# Patient Record
Sex: Female | Born: 1960 | Race: Black or African American | Hispanic: No | Smoking: Current every day smoker
Health system: Southern US, Community
[De-identification: ages and names within clinical notes are randomized; demographics above are authoritative.]

## PROBLEM LIST (undated history)

## (undated) DIAGNOSIS — D573 Sickle-cell trait: Secondary | ICD-10-CM

## (undated) DIAGNOSIS — I10 Essential (primary) hypertension: Secondary | ICD-10-CM

## (undated) DIAGNOSIS — F419 Anxiety disorder, unspecified: Secondary | ICD-10-CM

## (undated) DIAGNOSIS — M1990 Unspecified osteoarthritis, unspecified site: Secondary | ICD-10-CM

## (undated) DIAGNOSIS — Z5189 Encounter for other specified aftercare: Secondary | ICD-10-CM

## (undated) DIAGNOSIS — J449 Chronic obstructive pulmonary disease, unspecified: Secondary | ICD-10-CM

## (undated) HISTORY — DX: Anxiety disorder, unspecified: F41.9

## (undated) HISTORY — PX: COLONOSCOPY: SHX174

## (undated) HISTORY — PX: JOINT REPLACEMENT: SHX530

## (undated) HISTORY — PX: ABDOMINAL HYSTERECTOMY: SHX81

## (undated) HISTORY — DX: Chronic obstructive pulmonary disease, unspecified: J44.9

## (undated) HISTORY — PX: ROTATOR CUFF REPAIR: SHX139

## (undated) HISTORY — PX: FOOT SURGERY: SHX648

## (undated) HISTORY — DX: Encounter for other specified aftercare: Z51.89

## (undated) HISTORY — PX: TOTAL HIP ARTHROPLASTY: SHX124

## (undated) HISTORY — DX: Sickle-cell trait: D57.3

---

## 1998-09-08 ENCOUNTER — Emergency Department (HOSPITAL_COMMUNITY): Admission: EM | Admit: 1998-09-08 | Discharge: 1998-09-09 | Payer: Self-pay | Admitting: Emergency Medicine

## 1999-03-14 ENCOUNTER — Other Ambulatory Visit: Admission: RE | Admit: 1999-03-14 | Discharge: 1999-03-14 | Payer: Self-pay | Admitting: Internal Medicine

## 2000-05-05 ENCOUNTER — Other Ambulatory Visit: Admission: RE | Admit: 2000-05-05 | Discharge: 2000-05-05 | Payer: Self-pay | Admitting: Internal Medicine

## 2002-10-28 ENCOUNTER — Other Ambulatory Visit: Admission: RE | Admit: 2002-10-28 | Discharge: 2002-10-28 | Payer: Self-pay | Admitting: Obstetrics and Gynecology

## 2002-12-06 ENCOUNTER — Encounter (INDEPENDENT_AMBULATORY_CARE_PROVIDER_SITE_OTHER): Payer: Self-pay | Admitting: Specialist

## 2002-12-06 ENCOUNTER — Inpatient Hospital Stay (HOSPITAL_COMMUNITY): Admission: RE | Admit: 2002-12-06 | Discharge: 2002-12-09 | Payer: Self-pay | Admitting: Obstetrics and Gynecology

## 2004-10-11 ENCOUNTER — Ambulatory Visit (HOSPITAL_BASED_OUTPATIENT_CLINIC_OR_DEPARTMENT_OTHER): Admission: RE | Admit: 2004-10-11 | Discharge: 2004-10-11 | Payer: Self-pay | Admitting: Orthopaedic Surgery

## 2005-10-31 ENCOUNTER — Ambulatory Visit: Payer: Self-pay | Admitting: Internal Medicine

## 2005-11-08 ENCOUNTER — Ambulatory Visit: Payer: Self-pay | Admitting: Internal Medicine

## 2005-12-17 ENCOUNTER — Ambulatory Visit: Payer: Self-pay | Admitting: Internal Medicine

## 2005-12-17 ENCOUNTER — Ambulatory Visit (HOSPITAL_COMMUNITY): Admission: RE | Admit: 2005-12-17 | Discharge: 2005-12-17 | Payer: Self-pay | Admitting: Internal Medicine

## 2005-12-19 ENCOUNTER — Ambulatory Visit: Payer: Self-pay | Admitting: Internal Medicine

## 2005-12-25 ENCOUNTER — Ambulatory Visit: Payer: Self-pay | Admitting: Internal Medicine

## 2006-04-07 ENCOUNTER — Ambulatory Visit: Payer: Self-pay | Admitting: Internal Medicine

## 2007-02-12 ENCOUNTER — Ambulatory Visit: Payer: Self-pay | Admitting: Internal Medicine

## 2008-08-24 ENCOUNTER — Telehealth: Payer: Self-pay | Admitting: Internal Medicine

## 2008-09-13 ENCOUNTER — Telehealth (INDEPENDENT_AMBULATORY_CARE_PROVIDER_SITE_OTHER): Payer: Self-pay | Admitting: *Deleted

## 2009-06-01 ENCOUNTER — Ambulatory Visit: Payer: Self-pay | Admitting: Internal Medicine

## 2009-06-01 DIAGNOSIS — I10 Essential (primary) hypertension: Secondary | ICD-10-CM | POA: Insufficient documentation

## 2009-06-01 DIAGNOSIS — D509 Iron deficiency anemia, unspecified: Secondary | ICD-10-CM | POA: Insufficient documentation

## 2009-06-01 LAB — CONVERTED CEMR LAB
ALT: 11 units/L (ref 0–35)
Albumin: 4.3 g/dL (ref 3.5–5.2)
Alkaline Phosphatase: 69 units/L (ref 39–117)
Bilirubin, Direct: 0.1 mg/dL (ref 0.0–0.3)
CO2: 27 meq/L (ref 19–32)
Calcium: 9.5 mg/dL (ref 8.4–10.5)
Cholesterol: 163 mg/dL (ref 0–200)
Creatinine, Ser: 0.75 mg/dL (ref 0.40–1.20)
Glucose, Bld: 92 mg/dL (ref 70–99)
HDL: 60 mg/dL (ref >39)
Hemoglobin: 12.1 g/dL (ref 12.0–15.0)
Indirect Bilirubin: 0.4 mg/dL (ref 0.0–0.9)
LDL Cholesterol: 84 mg/dL (ref 0–99)
Lymphs Abs: 3.8 10*3/uL (ref 0.7–4.0)
MCHC: 31.1 g/dL (ref 30.0–36.0)
Monocytes Relative: 5 % (ref 3–12)
Neutro Abs: 4.5 10*3/uL (ref 1.7–7.7)
Platelets: 273 10*3/uL (ref 150–400)
RBC: 5.6 M/uL — ABNORMAL HIGH (ref 3.87–5.11)
Sodium: 144 meq/L (ref 135–145)
TSH: 0.549 microintl units/mL (ref 0.350–4.500)
Total Bilirubin: 0.5 mg/dL (ref 0.3–1.2)
Total CHOL/HDL Ratio: 2.7
Total Protein: 7.5 g/dL (ref 6.0–8.3)
Triglycerides: 96 mg/dL (ref <150)
VLDL: 19 mg/dL (ref 0–40)
WBC: 8.9 10*3/uL (ref 4.0–10.5)

## 2009-06-02 ENCOUNTER — Encounter: Payer: Self-pay | Admitting: Internal Medicine

## 2009-07-17 ENCOUNTER — Ambulatory Visit: Payer: Self-pay | Admitting: Internal Medicine

## 2009-07-17 DIAGNOSIS — J40 Bronchitis, not specified as acute or chronic: Secondary | ICD-10-CM | POA: Insufficient documentation

## 2009-11-16 ENCOUNTER — Ambulatory Visit: Payer: Self-pay | Admitting: Internal Medicine

## 2009-11-16 DIAGNOSIS — F17200 Nicotine dependence, unspecified, uncomplicated: Secondary | ICD-10-CM | POA: Insufficient documentation

## 2010-01-11 ENCOUNTER — Ambulatory Visit: Payer: Self-pay | Admitting: Internal Medicine

## 2010-01-11 ENCOUNTER — Telehealth: Payer: Self-pay | Admitting: Internal Medicine

## 2010-01-11 LAB — CONVERTED CEMR LAB
CO2: 31 meq/L (ref 19–32)
Glucose, Bld: 85 mg/dL (ref 70–99)
Sodium: 141 meq/L (ref 135–145)

## 2010-01-16 ENCOUNTER — Ambulatory Visit: Payer: Self-pay | Admitting: Internal Medicine

## 2010-10-24 ENCOUNTER — Encounter: Payer: Self-pay | Admitting: Internal Medicine

## 2010-11-13 NOTE — Assessment & Plan Note (Signed)
Summary: 2 month follow up/mhf rsc with pt Dr Artist Pais sick/mhf, reshed- jr   Vital Signs:  Patient profile:   50 year old female Weight:      149.50 pounds BMI:     28.35 O2 Sat:      99 % Temp:     97.9 degrees F oral Pulse rate:   66 / minute Pulse rhythm:   regular Resp:     16 per minute BP sitting:   110 / 70  (right arm) Cuff size:   regular  Vitals Entered By: Glendell Docker CMA (November 16, 2009 9:16 AM)  Primary Care Provider:  Dondra Spry DO  CC:  2 Month Follow up .  History of Present Illness: 2 Month Follow up   Hypertension Follow-Up      This is a 50 year old woman who presents for Hypertension follow-up.  The patient denies lightheadedness and headaches.  The patient denies the following associated symptoms: chest pain.  Compliance with medications (by patient report) has been near 100%.  The patient reports that dietary compliance has been fair.  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   Neck:  No deformities, masses, or tenderness noted. Lungs:  normal respiratory effort, normal breath sounds, and no wheezes.   Heart:  normal rate, regular rhythm, and no gallop.     Impression & Recommendations:  Problem # 1:  HYPERTENSION (ICD-401.9) Assessment  Improved BP is better but pt has chronic non productive cough.  likely ACE cough.  change to ARB.  If persistent cough, check CXR.  The following medications were removed from the medication list:    Lisinopril-hydrochlorothiazide 10-12.5 Mg Tabs (Lisinopril-hydrochlorothiazide) ..... One by mouth qd Her updated medication list for this problem includes:    Losartan Potassium-hctz 50-12.5 Mg Tabs (Losartan potassium-hctz) ..... One by mouth once daily  BP today: 110/70 Prior BP: 140/80 (07/17/2009)  Labs Reviewed: K+: 3.8 (06/01/2009) Creat: : 0.75 (06/01/2009)   Chol: 163 (06/01/2009)   HDL: 60 (06/01/2009)   LDL: 84 (06/01/2009)   TG: 96 (06/01/2009)  Problem # 2:  TOBACCO USER (ICD-305.1) Pt motivated to quit smoke.  reviewed how to use chantix.  we also discussed potential side effects.  Pt advised to report any significant change in mood  Her updated medication list for this problem includes:    Chantix Starting Month Pak 0.5 Mg X 11 & 1 Mg X 42 Tabs (Varenicline tartrate) .Marland Kitchen... Take as directed    Chantix Continuing Month Pak 1 Mg Tabs (Varenicline tartrate) .Marland Kitchen... Take as directed  Complete Medication List: 1)  Chantix Starting Month Pak 0.5 Mg X 11 & 1 Mg X 42 Tabs (Varenicline tartrate) .... Take as directed 2)  Chantix Continuing Month Pak 1 Mg  Tabs (Varenicline tartrate) .... Take as directed 3)  Losartan Potassium-hctz 50-12.5 Mg Tabs (Losartan potassium-hctz) .... One by mouth once daily  Patient Instructions: 1)  Please schedule a follow-up appointment in 2 months. 2)  BMP prior to visit, ICD-9: 401.9 3)  Please return for lab work one (1) week before your next appointment.  Prescriptions: LOSARTAN POTASSIUM-HCTZ 50-12.5 MG TABS (LOSARTAN POTASSIUM-HCTZ) one by mouth once daily  #30 x 3   Entered and Authorized by:   D. Thomos Lemons DO   Signed by:   D. Thomos Lemons DO on 11/16/2009   Method used:   Electronically to        [facility] [address] (retail)       [address]       [place], [place]  [address]       Ph: [phone]       Fax: [phone]   RxID:   [id number] CHANTIX CONTINUING MONTH PAK 1 MG TABS (VARENICLINE TARTRATE) take as directed  #1 x 3   Entered and Authorized by:   D. Thomos Lemons DO   Signed by:   D. Thomos Lemons DO on 11/16/2009   Method used:   Print then Give to Patient   RxID:   [id number] CHANTIX STARTING MONTH PAK 0.5 MG X 11 & 1 MG X 42 TABS (VARENICLINE TARTRATE) take as directed  #1 x 0   Entered and Authorized by:   D. Thomos Lemons DO   Signed by:   D. Thomos Lemons DO on 11/16/2009   Method used:   Print then Give to Patient   RxID:   [id number]   Current Allergies (reviewed today): LISINOPRIL (LISINOPRIL)   Immunization History:  Influenza Immunization History:    Influenza:  declined (11/16/2009)   Contraindications/Deferment of Procedures/Staging:    Test/Procedure: FLU VAX    Reason for deferment: patient declined

## 2010-11-13 NOTE — Assessment & Plan Note (Signed)
Summary: 2 month follow up/mhf   Vital Signs:  Patient profile:   50 year old female Height:      61 inches Weight:      149.75 pounds BMI:     28.40 O2 Sat:      100 % on Room air Temp:     98.2 degrees F oral Pulse rate:   59 / minute Pulse rhythm:   regular Resp:     18 per minute BP sitting:   120 / 72  (right arm) Cuff size:   regular  Vitals Entered By: Glendell Docker CMA (January 16, 2010 10:44 AM)  O2 Flow:  Room air CC: Rm 2- 2 Month Follow up  Comments chantix working well has cut back on smoking   Primary Care Provider:  DThomos Lemons DO  CC:  Rm 2- 2 Month Follow up .  History of Present Illness:  Hypertension Follow-Up      This is a 50 year old woman who presents for Hypertension follow-up.  The patient denies lightheadedness and headaches.  The patient denies the following associated symptoms: chest pain.  Compliance with medications (by patient report) has been near 100%.  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   Neck:  No deformities, masses, or tenderness noted. Lungs:  normal respiratory effort, normal breath sounds, and no wheezes.   Heart:  normal rate, regular rhythm, and no gallop.   Extremities:  No lower extremity edema    Impression &  Recommendations:  Problem # 1:  HYPERTENSION (ICD-401.9) cough resolved after stopping ACE.  Maintain current medication regimen.  Her updated medication list for this problem includes:    Losartan Potassium-hctz 50-12.5 Mg Tabs (Losartan potassium-hctz) ..... One by mouth once daily  BP today: 120/72 Prior BP: 110/70 (11/16/2009)  Labs Reviewed: K+: 3.4 (01/11/2010) Creat: : 0.7 (01/11/2010)   Chol: 163 (06/01/2009)   HDL: 60 (06/01/2009)   LDL: 84 (06/01/2009)   TG: 96 (06/01/2009)  Problem # 2:  TOBACCO USER (ICD-305.1) pt able to decrease tob use.  complete tob cessation encouraged  Her updated medication list for this problem includes:    Chantix Starting Month Pak 0.5 Mg X 11 & 1 Mg X 42 Tabs (Varenicline tartrate) .Marland Kitchen... Take as directed    Chantix Continuing Month Pak 1 Mg Tabs (Varenicline tartrate) .Marland Kitchen... Take as directed  Complete Medication List: 1)  Chantix Starting Month Pak 0.5 Mg X 11 & 1 Mg X 42 Tabs (Varenicline tartrate) .... Take as directed 2)  Chantix Continuing Month Pak 1 Mg Tabs (Varenicline tartrate) .... Take as directed 3)  Losartan Potassium-hctz 50-12.5 Mg Tabs (Losartan potassium-hctz) .... One by mouth once daily  Patient Instructions: 1)  Please schedule a follow-up appointment in  6 months. 2)  BMP prior to visit, ICD-9: 401.9 3)  Please return for lab work one (1) week before your next appointment.  Prescriptions: CHANTIX CONTINUING MONTH PAK 1 MG TABS (VARENICLINE TARTRATE) take as directed  #1 x 5   Entered and Authorized by:   D. Thomos Lemons DO   Signed by:   D. Thomos Lemons DO on 01/16/2010   Method used:   Electronically to        [facility] [address] (retail)       [address]       [place], [place]  [address]       Ph: [phone]       Fax: [phone]   RxID:   [id number] LOSARTAN POTASSIUM-HCTZ 50-12.5 MG TABS (LOSARTAN POTASSIUM-HCTZ) one by mouth once daily  #30 x 5   Entered and Authorized by:   D.  Thomos Lemons DO   Signed by:   D. Thomos Lemons DO on 01/16/2010   Method used:   Electronically to        [facility] [address] (retail)       [address]       [place], [place]  [address]       Ph: [phone]       Fax: [phone]   RxID:   [id number]   Current Allergies (reviewed today): LISINOPRIL (LISINOPRIL)

## 2010-11-13 NOTE — Progress Notes (Signed)
Summary: Lab Results  Phone Note Outgoing Call   Summary of Call: call pt - potassium is slightly low.  please advise pt to increase intake of high K foods Initial call taken by: D. Thomos Lemons DO,  January 11, 2010 4:42 PM  Follow-up for Phone Call        attempted to contact patient at [phone], no answer no voice mail Follow-up by: Glendell Docker CMA,  January 12, 2010 8:18 AM  Additional Follow-up for Phone Call Additional follow up Details #1::        Attempted to call pt. No answer or voicemail.  Pt. no longer works at wk # listed in chart.  Mervin Kung CMA  January 12, 2010 11:57 AM     Additional Follow-up for Phone Call Additional follow up Details #2::    patient advised per Dr Artist Pais instructions, she states she has an appointment tomorrow and will discuss with the Dr at the office visit. Follow-up by: Glendell Docker CMA,  January 15, 2010 8:23 AM

## 2010-11-15 NOTE — Letter (Signed)
Summary: Colonoscopy Letter  [facility]  [address] [place], [place] [address]   Phone: [phone]  Fax: [phone]      October 24, 2010 MRN: [id number]   MADDYN LIEURANCE [address] [place], [place]  [address]   Dear Ms. Cedeno,   According to your medical record, it is time for you to schedule a Colonoscopy. The American Cancer Society recommends this procedure as a method to detect early colon cancer. Patients with a family history of colon cancer, or a personal history of colon polyps or inflammatory bowel disease are at increased risk.  This letter has been generated based on the recommendations made at the time of your procedure. If you feel that in your particular situation this may no longer apply, please contact our office.  Please call our office at [phone] to schedule this appointment or to update your records at your earliest convenience.  Thank you for cooperating with [place] to provide you with the very best care possible.   Sincerely,  Hedwig Morton. Juanda Chance, M.D.  [facility] [phone]

## 2010-11-28 ENCOUNTER — Ambulatory Visit (INDEPENDENT_AMBULATORY_CARE_PROVIDER_SITE_OTHER): Payer: Self-pay | Admitting: Internal Medicine

## 2010-11-28 ENCOUNTER — Encounter: Payer: Self-pay | Admitting: Internal Medicine

## 2010-11-28 DIAGNOSIS — R51 Headache: Secondary | ICD-10-CM

## 2010-11-28 DIAGNOSIS — R519 Headache, unspecified: Secondary | ICD-10-CM | POA: Insufficient documentation

## 2010-11-28 DIAGNOSIS — I10 Essential (primary) hypertension: Secondary | ICD-10-CM

## 2010-12-20 NOTE — Assessment & Plan Note (Signed)
Summary: CONGESTION/MHF   Vital Signs:  Patient profile:   50 year old female Height:      61 inches Weight:      147 pounds BMI:     27.88 O2 Sat:      99 % on Room air Temp:     98.0 degrees F oral Pulse rate:   77 / minute Resp:     18 per minute BP sitting:   110 / 80  (left arm) Cuff size:   regular  Vitals Entered By: Glendell Docker CMA (November 28, 2010 10:32 AM)  O2 Flow:  Room air CC: facial problems Is Patient Diabetic? No Pain Assessment Patient in pain? no      Comments c/o teeth pain, right side facial swelling worsened today,     Last PAP Result Due   Primary Care Provider:  Dondra Spry DO  CC:  facial problems.  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   Head:  right facial swelling Mouth:  pharyngeal erythema.   dental caries right upper bicuspid and 1 molar Lungs:  normal respiratory effort, normal breath sounds, no crackles, and no wheezes.   Heart:  normal rate, regular rhythm, and no gallop.     Impression & Recommendations:  Problem # 1:  FACIAL PAIN (ICD-784.0) pt c/o right facial pain and swelling.  I suspect symptoms from dental caries / possible dental abscess.   take augmentin.  follow up with dentist Patient advised to call office if symptoms persist or worsen.  Problem # 2:   HYPERTENSION (ICD-401.9) Assessment: Unchanged  Her updated medication list for this problem includes:    Losartan Potassium-hctz 50-12.5 Mg Tabs (Losartan potassium-hctz) ..... One by mouth once daily  Orders: T-Basic Metabolic Panel [id number])  BP today: 110/80 Prior BP: 120/72 (01/16/2010)  Labs Reviewed: K+: 3.4 (01/11/2010) Creat: : 0.7 (01/11/2010)   Chol: 163 (06/01/2009)   HDL: 60 (06/01/2009)   LDL: 84 (06/01/2009)   TG: 96 (06/01/2009)  Complete Medication List: 1)  Losartan Potassium-hctz 50-12.5 Mg Tabs (Losartan potassium-hctz) .... One by mouth once daily 2)  Amoxicillin-pot Clavulanate 875-125 Mg Tabs (Amoxicillin-pot clavulanate) .... One by mouth two times a day  Patient Instructions: 1)  Follow up with your dentist re:  right facial swelling 2)  Call our office if your symptoms do not  improve or gets worse. 3)  Please schedule a follow-up appointment in 6 months. Prescriptions: LOSARTAN POTASSIUM-HCTZ 50-12.5 MG TABS (LOSARTAN POTASSIUM-HCTZ) one by mouth once daily  #90 x 1   Entered and Authorized by:   D. Thomos Lemons DO   Signed by:   D. Thomos Lemons DO on 11/28/2010   Method used:   Print then Give to Patient   RxID:   [id number] AMOXICILLIN-POT CLAVULANATE 875-125 MG TABS (AMOXICILLIN-POT CLAVULANATE) one  by mouth two times a day  #20 x 0   Entered and Authorized by:   D. Thomos Lemons DO   Signed by:   D. Thomos Lemons DO on 11/28/2010   Method used:   Print then Give to Patient   RxID:   [id number]    Orders Added: 1)  T-Basic Metabolic Panel [[id number]] 2)  Est. Patient Level III [14782]   Immunization History:  Influenza Immunization History:    Influenza:  declined (11/28/2010)   Immunization History:  Influenza Immunization History:    Influenza:  Declined (11/28/2010)  Current Allergies (reviewed today): LISINOPRIL (LISINOPRIL)      Preventive Care Screening  Mammogram:    Date:  11/28/2010    Results:  Due  Pap  Smear:    Date:  11/28/2010    Results:  Due

## 2011-03-01 NOTE — Discharge Summary (Signed)
NAME:  Katherine Jarvis, Katherine Jarvis                         ACCOUNT NO.:  [id number]   MEDICAL RECORD NO.:  [id number]                   PATIENT TYPE:  INP   LOCATION:  9115                                 FACILITY:  [facility]   PHYSICIAN:  Joslynne P. Romine, M.D.             DATE OF BIRTH:  07-Jul-1961   DATE OF ADMISSION:  12/06/2002  DATE OF DISCHARGE:  12/09/2002                                 DISCHARGE SUMMARY   DISCHARGE DIAGNOSES:  1. Uterine myomata.  2. Adenomyosis.  3. Benign ovarian fibroma.   HISTORY:  This is a 50 year old married black female gravida 4 para 3 with  menorrhagia requiring IV iron therapy that was unresponsive to oral  contraceptives, and an ultrasound had shown numerous myomata including what  was felt to be a 6 cm pedunculated fibroid.  She was status post a  salpingectomy for an ectopic, a BPSP, and a C section which required  transfusion in 1983.   HOSPITAL COURSE:  On December 06, 2002 she was admitted and underwent a TAH  and a right salpingo-oophorectomy.  What was felt to be a pedunculated  fibroid on exploratory laparotomy actually turned out to be a right ovarian  mass.  This came back from pathology consistent with a fibroma.  Postoperatively she did well.  Her postoperative H&H was 8 and 25.  She was  in satisfactory condition by postoperative day #3.   DISPOSITION:  She was given instructions and follow up in the office in six  weeks, a prescription for Percocet 5 mg #20 one p.o. q.4h. p.r.n. pain, and  instructions in pelvic rest.   LABORATORY DATA:  Pathology report did show leiomyomata and adenomyosis and  a right ovarian fibroma.  Peritoneal washings showed nonmalignant cells.  Admission H&H 12 and 38; on discharge 8 and 24.9.  Pregnancy test was  negative.  PT and PTT were normal.                                               Dayja P. Romine, M.D.    CPR/MEDQ  D:  01/03/2003  T:  01/04/2003  Job:  [id number]

## 2011-03-01 NOTE — Op Note (Signed)
NAME:  Katherine Jarvis, Katherine Jarvis               ACCOUNT NO.:  [id number]   MEDICAL RECORD NO.:  [id number]          PATIENT TYPE:  AMB   LOCATION:  DSC                          FACILITY:  [facility]   PHYSICIAN:  Vanita Panda. 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   PROCEDURES:  1.  Right shoulder arthroscopy with debridement.  2.  Subacromial decompression.   FINDINGS:  A small longitudinal rotator cuff tear and large bursectomy with  anterior acromial osteophytes.   SURGEON:  Vanita Panda. Magnus Ivan, M.D.   ANESTHESIA:  Regional anesthesia with interscalene block and general.   COMPLICATIONS:  None.   BLOOD LOSS:  Minimal.   INDICATIONS:  Briefly, Katherine Jarvis is a 50 year old, right-hand dominant  female with a history of shoulder pain for approximately one year.  She had  a fall earlier in the year and had shoulder pain since that time.  She has  had pain in other areas of her arm consistent with lateral epicondylitis as  well.  These things had resolved, but she continued to have pain in her  shoulder and an MRI was obtained.  It did show significant fluid in the  subacromial space with bursitis, as well as a small anterior leading edge  tear of the supraspinatus tendon.  After failure of nonoperative treatment  and conservative treatment, including physical therapy, injections and  nonsteroidals, she continued to have pain and it was recommended that she  underwent diagnostic arthroscopy with debridement versus repair with  subacromial decompression.   PROCEDURE DESCRIPTION:  After the regional block was obtained, she was  brought to the operating room and placed supine on the operating table and  general anesthesia was obtained.  She was then placed in a beach chair  position with her right arm  in traction of 10 pounds in forward elevation  and slight abduction.  The arm was prepped and draped with Duraprep and  sterile drapes.  First a posterior portal was made just off the edge of the  acromion in a soft spot and the arthroscope was inserted.  A diagnostic tour  was taken of the glenohumeral joint and there was noted synovitis, but the  humeral head looked to be intact with intact cartilage, as well as the  glenoid and the labrum.  There was also noted an intact biceps tendon.  There was a synovitis with the cuff and what appeared to be evidence of a  small longitudinal cuff tear.  An anterior portal was made __________ and an  arthroscopic shaver was inserted.  Limited debridement was performed within  the shoulder labrum.  The shaver was then removed and through the posterior  portal the arthroscope was inserted into the subacromial space.  There was  noted an abundant amount of bursectomy, as well as spurring of the acromion.  A lateral portal was made sharply with the knife and an ablator was placed  through this  portal.  A subacromial decompression was then performed, as  well as removing osteophytes on the undersurface of the acromion.  Once  complete bursectomy was performed, the shoulder was put through internal and  external rotation and the rotator cuff was found to be intact.  The scope  and ablator were removed.  The  portals were closed with interrupted 4-0 nylon sutures.  A well-padded  sterile dressing was then placed and the patient was placed in the arm  sling.  She was awakened, extubated and taken to the recovery room in stable  condition.  Again, there were no complications.       CYB/MEDQ  D:  10/11/2004  T:  10/11/2004  Job:  [id number]

## 2011-03-01 NOTE — Op Note (Signed)
NAME:  Katherine Jarvis, Katherine Jarvis                         ACCOUNT NO.:  [id number]   MEDICAL RECORD NO.:  [id number]                   PATIENT TYPE:  INP   LOCATION:  9399                                 FACILITY:  [facility]   PHYSICIAN:  Raeghan P. Romine, M.D.             DATE OF BIRTH:  09-Jul-1961   DATE OF PROCEDURE:  12/06/2002  DATE OF DISCHARGE:                                 OPERATIVE REPORT   PREOPERATIVE DIAGNOSES:  1. Menorrhagia.  2. Uterine fibroids.   POSTOPERATIVE DIAGNOSES:  1. Menorrhagia.  2. Uterine fibroids.  3. 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   PROCEDURE:  1. Total abdominal hysterectomy.  2. Right salpingo-oophorectomy.   SURGEON:  Raylei P. Romine, M.D.   ASSISTANT:  Andres Ege, M.D.   ANESTHESIA:  General endotracheal.   ESTIMATED BLOOD LOSS:  700 mL.   COMPLICATIONS:  None.   PROCEDURE:  The patient was taken to the operating room and after the  induction of adequate general endotracheal anesthesia was prepped and draped  in the usual fashion and Foley catheter inserted.  A Pfannenstiel incision  was made at the site of the previous Pfannenstiel.  Incision was carried  down to the fascia using a deep knife.  The fascia was nicked and opened  transversely.  Kochers were used to grasp the fascial margins and it was  separated from the underlying rectus muscles using sharp dissection.  Rectus  muscles were separated sharply in the midline.  The underlying peritoneum  was elevated between hemostats and entered atraumatically.  There were some  adhesions between the omentum and the anterior peritoneum on the patient's  right.  These were taken down sharply.  Exploration of the abdomen revealed  there to be an approximately 6 cm ovarian mass.  This was felt  preoperatively to have been a pedunculated fibroid.  The uterus contained  multiple small fibroids.  The left ovary and tube were normal.  The uterus  was grasped at its cornu with  long Kellys.  The round ligaments were  clamped, cut, and tied on either side.  The anterior leaf of the broad  ligament was taken down on the patient's right.  The ureter was identified.  The infundibulopelvic ligament was skeletonized, doubly clamped with  Heaneys, cut, and then doubly tied with 0 chromic.  On the patient's left  the ureter was identified.  A window was made in the broad ligament and a  clamp was placed across the pedicle containing the utero-ovarian ligament,  tube, and round ligament.  This was clamped, cut, and doubly tied with 0  chromic.  The ovary was then detached from the uterus and sent for frozen  section and the frozen section report came back showing a benign tumor  fibroma versus thecoma.  The anterior leaf of the broad ligament was taken  down.  The bladder was sharply removed from the cervix.  The uterine  arteries were skeletonized, clamped, and doubly tied on each side.  The  hysterectomy continued down the cardinal ligament clamping, cutting, and  tying in sequence.  The uterosacral ligaments were taken as separate  pedicles and held.  The vagina was entered at the corner on the right and  the specimen removed with Satinsky scissors.  Kochers were used to grasp the  vaginal cuff and angle stitches were placed in the right and left side.  The  vaginal cuff was then closed with interrupted figure-of-eight sutures of 0  chromic.  There was some bleeding underneath the bladder that was controlled  with a 2-0 figure-of-eight and also cautery.  The left ovary was suspended  to the round ligament.  The pelvis was irrigated and felt to be hemostatic.  Uterosacral ligaments were tied together in midline.  The packs that had  been used for retraction of the bowel were removed.  Sponge, needle, and  instrument counts were correct at that point.  The peritoneum was closed in  a running fashion using 2-0 chromic.  The fascia was closed in a running  fashion going  from each angle towards the midline using 0 Vicryl.  Hemostasis was achieved in the subcutaneous tissue with the Bovie.  The  subcutaneous tissue and fascia were injected with a total of 10 mL of 0.5%  Marcaine with epinephrine.  The incision was closed subcuticularly with 3-0  Vicryl Rapide.  Benzoin was applied to the skin and Steri-Strips were used.  Closed the skin and the procedure was terminated.  The patient tolerated it  well and went in satisfactory condition to post anesthesia recovery.                                               Juli P. Romine, M.D.    CPR/MEDQ  D:  12/06/2002  T:  12/06/2002  Job:  [id number]

## 2011-03-15 ENCOUNTER — Telehealth: Payer: Self-pay | Admitting: Internal Medicine

## 2011-03-15 MED ORDER — LOSARTAN POTASSIUM-HCTZ 50-12.5 MG PO TABS: 1.0000 | ORAL_TABLET | Freq: Every day | ORAL | Status: DC

## 2011-03-15 NOTE — Telephone Encounter (Signed)
Rx refill sent to pharmacy. 

## 2011-03-15 NOTE — Telephone Encounter (Signed)
Refill- losartan/hctz 50/12.5mg  tablets. Take 1 tablet by mouth every day. Qty 30. Last fill 3.22.12

## 2011-11-11 ENCOUNTER — Telehealth: Payer: Self-pay | Admitting: Internal Medicine

## 2011-11-11 MED ORDER — LOSARTAN POTASSIUM-HCTZ 50-12.5 MG PO TABS: 1.0000 | ORAL_TABLET | Freq: Every day | ORAL | Status: DC

## 2011-11-11 NOTE — Telephone Encounter (Signed)
rx sent in electronically 

## 2011-11-11 NOTE — Telephone Encounter (Signed)
Pt moved and has lost her rx for Losartan. Please call in a new rx to [facility]. Thanks.

## 2012-02-26 ENCOUNTER — Telehealth: Payer: Self-pay | Admitting: Internal Medicine

## 2012-02-26 MED ORDER — LOSARTAN POTASSIUM-HCTZ 50-12.5 MG PO TABS: 1.0000 | ORAL_TABLET | Freq: Every day | ORAL | Status: DC

## 2012-02-26 NOTE — Telephone Encounter (Signed)
Rx of BP medication provided

## 2012-02-26 NOTE — Telephone Encounter (Signed)
Pt called is req to get a script for losartan-hydrochlorothiazide (HYZAAR) 50-12.5 MG per tablet called in to [facility] on [address] or would like to have a written script for pick up since pts son is in for ov today to see Dr Artist Pais.

## 2012-02-27 NOTE — Telephone Encounter (Signed)
Per chart it was printed yesterday and given to pt

## 2012-06-29 ENCOUNTER — Encounter: Payer: Self-pay | Admitting: Internal Medicine

## 2013-01-25 ENCOUNTER — Ambulatory Visit: Payer: Self-pay | Admitting: Internal Medicine

## 2013-02-01 ENCOUNTER — Encounter: Payer: Self-pay | Admitting: Internal Medicine

## 2013-02-01 ENCOUNTER — Ambulatory Visit (INDEPENDENT_AMBULATORY_CARE_PROVIDER_SITE_OTHER): Payer: PRIVATE HEALTH INSURANCE | Admitting: Internal Medicine

## 2013-02-01 VITALS — BP 130/80 | HR 92 | Temp 97.7°F | Resp 20 | Wt 154.0 lb

## 2013-02-01 DIAGNOSIS — M25551 Pain in right hip: Secondary | ICD-10-CM

## 2013-02-01 DIAGNOSIS — M549 Dorsalgia, unspecified: Secondary | ICD-10-CM

## 2013-02-01 DIAGNOSIS — M25559 Pain in unspecified hip: Secondary | ICD-10-CM | POA: Insufficient documentation

## 2013-02-01 DIAGNOSIS — I10 Essential (primary) hypertension: Secondary | ICD-10-CM

## 2013-02-01 MED ORDER — LOSARTAN POTASSIUM-HCTZ 50-12.5 MG PO TABS: 1.0000 | ORAL_TABLET | Freq: Every day | ORAL | Status: DC

## 2013-02-01 NOTE — Progress Notes (Signed)
Subjective:    Patient ID: Katherine Jarvis, female    DOB: 17-Oct-1960, 52 y.o.   MRN: [id number]  HPI  52 year old patient who has a history of treated hypertension. She has not been seen here in some time and lives in [place] and often visits her daughter here in [place]. She presents with complaints of low back and right groin pain. She did have a lumbar MRI performed in [place] in December of last year. 2 months ago she had cervical and lumbar x-rays performed. Her chief complaint is low back pain and right groin pain. She describes the muscle cramping involving the right upper leg. She has received extensive physical therapy in [place] without much. Medical regimen has included analgesics anti-inflammatories and muscle relaxers. She has treated hypertension which has been well-controlled.  History reviewed. No pertinent past medical history.  History   Social History  . Marital Status: Single    Spouse Name: N/A    Number of Children: N/A  . Years of Education: N/A   Occupational History  . Not on file.   Social History Main Topics  . Smoking status: Current Every Day Smoker    Types: Cigarettes  . Smokeless tobacco: Never Used  . Alcohol Use: No  . Drug Use: No  . Sexually Active: Not on file   Other Topics Concern  . Not on file   Social History Narrative  . No narrative on file    History reviewed. No pertinent past surgical history.  No family history on file.  Allergies  Allergen Reactions  . Lisinopril     REACTION: cough    Current Outpatient Prescriptions on File Prior to Visit  Medication Sig Dispense Refill  . losartan-hydrochlorothiazide (HYZAAR) 50-12.5 MG per tablet Take 1 tablet by mouth daily.  90 tablet  0   No current facility-administered medications on file prior to visit.    BP 130/80  Pulse 92  Temp(Src) 97.7 F (36.5 C) (Oral)  Resp 20  Wt 154 lb (69.854 kg)  BMI 29.11 kg/m2  SpO2 96%  LMP  09/17/1999       Review of Systems  Constitutional: Negative.   HENT: Negative for hearing loss, congestion, sore throat, rhinorrhea, dental problem, sinus pressure and tinnitus.   Eyes: Negative for pain, discharge and visual disturbance.  Respiratory: Negative for cough and shortness of breath.   Cardiovascular: Negative for chest pain, palpitations and leg swelling.  Gastrointestinal: Negative for nausea, vomiting, abdominal pain, diarrhea, constipation, blood in stool and abdominal distention.  Genitourinary: Negative for dysuria, urgency, frequency, hematuria, flank pain, vaginal bleeding, vaginal discharge, difficulty urinating, vaginal pain and pelvic pain.  Musculoskeletal: Positive for back pain and arthralgias. Negative for joint swelling and gait problem.  Skin: Negative for rash.  Neurological: Negative for dizziness, syncope, speech difficulty, weakness, numbness and headaches.  Hematological: Negative for adenopathy.  Psychiatric/Behavioral: Negative for behavioral problems, dysphoric mood and agitation. The patient is not nervous/anxious.        Objective:   Physical Exam  Constitutional: She appears well-developed and well-nourished. No distress.  Blood pressure 120/78  Musculoskeletal:  Seen to be hypersensitive to gentle palpation over the lumbar spine Tended to walk with a slight lamp Straight leg test was negative but did cause some mild lumbar tightness Range of motion the right hip was painful          Assessment & Plan:   Back pain Right hip pain  Symptoms have been chronic and  refractory to standard interventions including physical therapy. We'll set up for rheumatology evaluation. The patient does have cervical and lumbar x-rays to review as well as a lumbar MRI. No hip  radiographs available

## 2013-02-01 NOTE — Patient Instructions (Addendum)
Rheumatology consultation as discussed  Please check your blood pressure on a regular basis.  If it is consistently greater than 150/90, please make an office appointment.  Limit your sodium (Salt) intake  Return in 6 months for follow-up

## 2013-02-04 ENCOUNTER — Telehealth: Payer: Self-pay | Admitting: *Deleted

## 2013-02-04 NOTE — Telephone Encounter (Signed)
Pt walked into office needing note for work to be out for the week. Discussed with Dr. Amador Cunas and Dorette Grate to be off of work for this week. Note given for 4/21 through 4/28. RTW on 4/28. Note given to pt, she verbalized understanding to return to work on Monday 4/28.

## 2013-02-15 ENCOUNTER — Telehealth: Payer: Self-pay | Admitting: Internal Medicine

## 2013-02-15 NOTE — Telephone Encounter (Signed)
done

## 2013-02-15 NOTE — Telephone Encounter (Signed)
Referral is already in computer.  Joni Reining, can you call and schedule appt.  Thanks

## 2013-02-15 NOTE — Telephone Encounter (Signed)
Patient called stating that she was referred to a Rheumatologist and they did not accept her insurance. Patient works in [place] and have found a Rheumatologist there and they need a referral and notes faxed over to Dr. Arlys John fax number [phone]. Please assist. They can see patient Thursday if this is done today.

## 2013-02-24 ENCOUNTER — Telehealth: Payer: Self-pay | Admitting: Internal Medicine

## 2013-02-24 NOTE — Telephone Encounter (Signed)
[facility] Triage Call Report Triage Record Num: [id number] Operator: Chevis Pretty Patient Name: Katherine Jarvis Call Date & Time: 02/22/2013 4:41:33PM Patient Phone: [phone] PCP: Gordy Savers Patient Gender: Female PCP Fax : [phone] Patient DOB: 10/14/1961 [facility] Name[facility] Reason for Call: Caller: Ronit/Patient; PCP: Eleonore Chiquito (Family Practice); CB#: [phone]; Call regarding Toothache; states she can see the dentist on a walk in basis in [place], but states she does not want to pay him twice, and wants to start an antibiotic before being seen by the dentist. Per toothache protocol, emergent symptoms denied; advised appt within 24 hours with dentist. Patient will see dentist in AM 02/23/13. Protocol(s) Used: Teeth and Jaw Symptoms Recommended Outcome per Protocol: See Dentist Within 24 Hours Reason for Outcome: Localized swelling of face AND toothache (without injury) Care Advice: ~ Inform provider of any previous reaction or sensitivity to penicillin. Call provider if temperature is 101.5 F (38.6 C) or greater or any temperature elevation in an immunocompromised patient (such as diabetes, HIV/AIDS, renal disease, chemotherapy, organ transplant, or chronic steroid use) ~ During pregnancy, call provider if temperature is 100 F (37.7 C) or greater OR any temperature elevation for 3 days even while taking acetaminophen. ~ ~ SYMPTOM / CONDITION MANAGEMENT ~ CAUTIONS Analgesic/Antipyretic Advice - Acetaminophen: Consider acetaminophen as directed on label or by pharmacist/provider for pain or fever PRECAUTIONS: - Use if there is no history of liver disease, alcoholism, or intake of three or more alcohol drinks per day - Only if approved by provider during pregnancy or when breastfeeding - During pregnancy, acetaminophen should not be taken more than 3 consecutive days without telling provider - Do not exceed recommended dose or  frequency ~ Apply local moist heat (such as a warm, wet wash cloth covered with plastic wrap) to the area for 15-20 minutes every 2-3 hours while awake. ~ Analgesic/Antipyretic Advice - NSAIDs: Consider aspirin, ibuprofen, naproxen or ketoprofen for pain or fever as directed on label or by pharmacist/provider. PRECAUTIONS: - If over 66 years of age, should not take longer than 1 week without consulting provider. EXCEPTIONS: - Should not be used if taking blood thinners or have bleeding problems. - Do not use if have history of sensitivity/allergy to any of these medications; or history of cardiovascular, ulcer, kidney, liver disease or diabetes unless approved by provider. - Do not exceed recommended dose or frequency. ~ 02/22/2013 5:28:29PM Page 1 of 1 [id number]

## 2015-05-02 DIAGNOSIS — M545 Low back pain, unspecified: Secondary | ICD-10-CM | POA: Insufficient documentation

## 2015-05-02 DIAGNOSIS — M1610 Unilateral primary osteoarthritis, unspecified hip: Secondary | ICD-10-CM | POA: Insufficient documentation

## 2015-05-02 DIAGNOSIS — F17210 Nicotine dependence, cigarettes, uncomplicated: Secondary | ICD-10-CM | POA: Insufficient documentation

## 2015-05-16 ENCOUNTER — Encounter: Payer: Self-pay | Admitting: Internal Medicine

## 2015-05-31 DIAGNOSIS — G5603 Carpal tunnel syndrome, bilateral upper limbs: Secondary | ICD-10-CM | POA: Insufficient documentation

## 2015-05-31 DIAGNOSIS — M159 Polyosteoarthritis, unspecified: Secondary | ICD-10-CM | POA: Insufficient documentation

## 2015-05-31 DIAGNOSIS — M8949 Other hypertrophic osteoarthropathy, multiple sites: Secondary | ICD-10-CM | POA: Insufficient documentation

## 2015-07-05 DIAGNOSIS — F331 Major depressive disorder, recurrent, moderate: Secondary | ICD-10-CM | POA: Insufficient documentation

## 2015-07-05 DIAGNOSIS — E0590 Thyrotoxicosis, unspecified without thyrotoxic crisis or storm: Secondary | ICD-10-CM | POA: Insufficient documentation

## 2015-10-20 DIAGNOSIS — F5101 Primary insomnia: Secondary | ICD-10-CM | POA: Insufficient documentation

## 2016-05-08 ENCOUNTER — Ambulatory Visit (HOSPITAL_COMMUNITY)
Admission: EM | Admit: 2016-05-08 | Discharge: 2016-05-08 | Disposition: A | Discharge status: Home / Self Care | Payer: BLUE CROSS/BLUE SHIELD

## 2016-05-08 ENCOUNTER — Encounter (HOSPITAL_COMMUNITY): Payer: Self-pay | Admitting: Emergency Medicine

## 2016-05-08 DIAGNOSIS — J340 Abscess, furuncle and carbuncle of nose: Secondary | ICD-10-CM

## 2016-05-08 MED ORDER — MUPIROCIN CALCIUM 2 % EX CREA: 1.0000 "application " | TOPICAL_CREAM | Freq: Two times a day (BID) | CUTANEOUS | 0 refills | Status: DC

## 2016-05-08 MED ORDER — SULFAMETHOXAZOLE-TRIMETHOPRIM 800-160 MG PO TABS: 1.0000 | ORAL_TABLET | Freq: Two times a day (BID) | ORAL | 0 refills | Status: AC

## 2016-05-08 NOTE — ED Triage Notes (Signed)
The patient presented to the [facility] with a complaint of dental pain that started 2 days ago.

## 2016-08-09 ENCOUNTER — Ambulatory Visit (HOSPITAL_COMMUNITY)
Admission: EM | Admit: 2016-08-09 | Discharge: 2016-08-09 | Disposition: A | Discharge status: Home / Self Care | Payer: BLUE CROSS/BLUE SHIELD | Attending: Internal Medicine | Admitting: Internal Medicine

## 2016-08-09 ENCOUNTER — Encounter (HOSPITAL_COMMUNITY): Payer: Self-pay | Admitting: Emergency Medicine

## 2016-08-09 DIAGNOSIS — G8929 Other chronic pain: Secondary | ICD-10-CM

## 2016-08-09 DIAGNOSIS — K089 Disorder of teeth and supporting structures, unspecified: Secondary | ICD-10-CM | POA: Diagnosis not present

## 2016-08-09 MED ORDER — PENICILLIN V POTASSIUM 500 MG PO TABS
500.0000 mg | ORAL_TABLET | Freq: Four times a day (QID) | ORAL | 0 refills | Status: AC
Start: 2016-08-09 — End: 2016-08-19

## 2016-08-09 NOTE — Discharge Instructions (Signed)
Dental follow up as able

## 2016-08-09 NOTE — ED Triage Notes (Signed)
Here for right upper dental pain onset yest  A&O x4... NAD

## 2016-08-10 NOTE — ED Provider Notes (Signed)
CSN: [id number]     Arrival date & time 08/09/16  1932 History   None    Chief Complaint  Patient presents with  . Dental Pain   (Consider location/radiation/quality/duration/timing/severity/associated sxs/prior Treatment) HPI NP 55 Y/O FEMALE WITH FRONT LOWER RIGHT INCISOR PAIN FOR THE LAST WEEK. TOOTH CRACKED, AND HAS PAIN AND SWELLING. NO RELIEF WITH OTC MEDS.  History reviewed. No pertinent past medical history. History reviewed. No pertinent surgical history. History reviewed. No pertinent family history. Social History  Substance Use Topics  . Smoking status: Current Every Day Smoker    Types: Cigarettes  . Smokeless tobacco: Never Used  . Alcohol use No   OB History    No data available     Review of Systems  Denies: HEADACHE, NAUSEA, ABDOMINAL PAIN, CHEST PAIN, CONGESTION, DYSURIA, SHORTNESS OF BREATH  Allergies  Lisinopril  Home Medications   Prior to Admission medications   Medication Sig Start Date End Date Taking? Authorizing Provider  Biotin 5000 MCG CAPS Take 1 capsule by mouth daily.   Yes Historical Provider, MD  gabapentin (NEURONTIN) 300 MG capsule Take 300 mg by mouth 3 (three) times daily.   Yes Historical Provider, MD  losartan-hydrochlorothiazide (HYZAAR) 50-12.5 MG per tablet Take 1 tablet by mouth daily. 02/01/13  Yes Marletta Lor, MD  TRAZODONE HCL PO Take by mouth.   Yes Historical Provider, MD  zolpidem (AMBIEN) 10 MG tablet Take 10 mg by mouth at bedtime as needed for sleep.   Yes Historical Provider, MD  Calcium-Vitamin D 600-200 MG-UNIT per tablet Take 1 tablet by mouth daily.    Historical Provider, MD  mupirocin cream (BACTROBAN) 2 % Apply 1 application topically 2 (two) times daily. 05/08/16   Konrad Felix, PA  penicillin v potassium (VEETID) 500 MG tablet Take 1 tablet (500 mg total) by mouth 4 (four) times daily. 08/09/16 08/19/16  Konrad Felix, PA   Meds Ordered and Administered this Visit  Medications - No data to  display  BP 132/74 (BP Location: Left Arm)   Pulse 62   Temp 97.9 F (36.6 C) (Oral)   Resp 16   LMP 09/17/1999   SpO2 100%  No data found.   Physical Exam NURSES NOTES AND VITAL SIGNS REVIEWED. CONSTITUTIONAL: Well developed, well nourished, no acute distress HEENT: normocephalic, atraumatic, DENTAL EXAM: BROKEN RIGHT LOWER CENTRAL INCISOR. TENDER TO PALPATION, PULP IS EXPOSED. NO PALPABLE ABSCESS.  EYES: Conjunctiva normal NECK:normal ROM, supple, no adenopathy PULMONARY:No respiratory distress, normal effort ABDOMINAL: Soft, ND, NT BS+, No CVAT MUSCULOSKELETAL: Normal ROM of all extremities,  SKIN: warm and dry without rash PSYCHIATRIC: Mood and affect, behavior are normal  Urgent Care Course   Clinical Course    Procedures (including critical care time)  Labs Review Labs Reviewed - No data to display  Imaging Review No results found.   Visual Acuity Review  Right Eye Distance:   Left Eye Distance:   Bilateral Distance:    Right Eye Near:   Left Eye Near:    Bilateral Near:         MDM   1. Chronic dental pain     Patient is reassured that there are no issues that require transfer to higher level of care at this time or additional tests. Patient is advised to continue home symptomatic treatment. Patient is advised that if there are new or worsening symptoms to attend the emergency department, contact primary care provider, or return to UC. Instructions of care provided  discharged home in stable condition.    THIS NOTE WAS GENERATED USING A VOICE RECOGNITION SOFTWARE PROGRAM. ALL REASONABLE EFFORTS  WERE MADE TO PROOFREAD THIS DOCUMENT FOR ACCURACY.  I have verbally reviewed the discharge instructions with the patient. A printed AVS was given to the patient.  All questions were answered prior to discharge.      Konrad Felix, [place] 08/10/16 1529

## 2016-10-22 DIAGNOSIS — E559 Vitamin D deficiency, unspecified: Secondary | ICD-10-CM | POA: Insufficient documentation

## 2016-10-30 ENCOUNTER — Ambulatory Visit (INDEPENDENT_AMBULATORY_CARE_PROVIDER_SITE_OTHER): Payer: Self-pay | Admitting: Orthopaedic Surgery

## 2016-12-04 ENCOUNTER — Ambulatory Visit (INDEPENDENT_AMBULATORY_CARE_PROVIDER_SITE_OTHER): Payer: BLUE CROSS/BLUE SHIELD | Admitting: Orthopaedic Surgery

## 2016-12-04 ENCOUNTER — Ambulatory Visit (INDEPENDENT_AMBULATORY_CARE_PROVIDER_SITE_OTHER): Payer: Self-pay

## 2016-12-04 DIAGNOSIS — G8929 Other chronic pain: Secondary | ICD-10-CM

## 2016-12-04 DIAGNOSIS — M25511 Pain in right shoulder: Secondary | ICD-10-CM

## 2016-12-04 MED ORDER — LIDOCAINE HCL 1 % IJ SOLN
3.0000 mL | INTRAMUSCULAR | Status: AC | PRN
  Administered 2016-12-04: 3 mL

## 2016-12-04 MED ORDER — METHYLPREDNISOLONE ACETATE 40 MG/ML IJ SUSP
40.0000 mg | INTRAMUSCULAR | Status: AC | PRN
  Administered 2016-12-04: 40 mg via INTRA_ARTICULAR

## 2016-12-04 MED ORDER — DICLOFENAC SODIUM 75 MG PO TBEC: 75.0000 mg | DELAYED_RELEASE_TABLET | Freq: Two times a day (BID) | ORAL | 1 refills | Status: DC | PRN

## 2016-12-04 NOTE — Progress Notes (Signed)
Office Visit Note   Patient: Katherine Jarvis           Date of Birth: 1960-12-04           MRN: [id number] Visit Date: 12/04/2016              Requested by: Solmon Ice, MD [address] [place], [place] [address] PCP: Solmon Ice, MD   Assessment & Plan: Visit Diagnoses:  1. Chronic right shoulder pain     Plan: She understands that we are recommending a steroid injection for her right shoulder. I will send in some diclofenac as an anti-inflammatory. I see her back in 2 weeks to see how she tolerated that injection. At that visit we can obtain a low AP pelvis to evaluate her right hip replacement.  Follow-Up Instructions: Return in about 2 weeks (around 12/18/2016).   Orders:  Orders Placed This Encounter  Procedures  . Large Joint Injection/Arthrocentesis  . XR Shoulder Right   Meds ordered this encounter  Medications  . diclofenac (VOLTAREN) 75 MG EC tablet    Sig: Take 1 tablet (75 mg total) by mouth 2 (two) times daily between meals as needed.    Dispense:  60 tablet    Refill:  1      Procedures: Large Joint Inj Date/Time: 12/04/2016 2:12 PM Performed by: Kathryne Hitch Authorized by: Kathryne Hitch   Location:  Shoulder Site:  R subacromial bursa Ultrasound Guidance: No   Fluoroscopic Guidance: No   Arthrogram: No   Medications:  3 mL lidocaine 1 %; 40 mg methylPREDNISolone acetate 40 MG/ML     Clinical Data: No additional findings.   Subjective: No chief complaint on file. The patient comes in a history of chronic right shoulder pain. Apparently him I have her for an arthroscopic intervention her shoulder around 2006. As we have paper chart so there is no record of this year now. She moved away and is now moved back to [place]. She's had problems with chronic pain in that right shoulder. She like to have it evaluated today. She does have a history of a right hip replacement that was performed in [place]  and she wants that evaluated at a subsequent visit She does report pain and weakness with her right shoulder. She denies any numb sending him hand. She is on some chronic medications HPI  Review of Systems She denies any chest pain, headache, shortness of breath, fever, chills,, vomiting  Objective: Vital Signs: LMP 09/17/1999   Physical Exam She is alert and oriented 3 and in no acute distress Ortho Exam Examination her right shoulder shows fluid range of motion at shoulder. She is not using her deltoids to lift her shoulder. There are scars from previous surgery were shoulder but it is not looked like it was a rotator cuff repair. Her external rotation is full. Her internal rotation with adduction is full. She has some slight grinding of the before meals joint just pain with activities but it does not appear to be a weak rotator cuff. Specialty Comments:  No specialty comments available.  Imaging: Xr Shoulder Right  Result Date: 12/04/2016 3 views of her right shoulder AP outlet and axillary show well located shoulder. There is some slight arthritis at the acromioclavicular joint. There is no acute findings.    PMFS History: Patient Active Problem List   Diagnosis Date Noted  . Back pain 02/01/2013  . Hip pain 02/01/2013  .  FACIAL PAIN 11/28/2010  . TOBACCO USER 11/16/2009  . BRONCHITIS 07/17/2009  . ANEMIA, IRON DEFICIENCY 06/01/2009  . HYPERTENSION 06/01/2009   No past medical history on file.  No family history on file.  No past surgical history on file. Social History   Occupational History  . Not on file.   Social History Main Topics  . Smoking status: Current Every Day Smoker    Types: Cigarettes  . Smokeless tobacco: Never Used  . Alcohol use No  . Drug use: No  . Sexual activity: Not on file

## 2016-12-18 ENCOUNTER — Ambulatory Visit (INDEPENDENT_AMBULATORY_CARE_PROVIDER_SITE_OTHER): Payer: BLUE CROSS/BLUE SHIELD | Admitting: Orthopaedic Surgery

## 2016-12-18 ENCOUNTER — Ambulatory Visit (INDEPENDENT_AMBULATORY_CARE_PROVIDER_SITE_OTHER): Payer: BLUE CROSS/BLUE SHIELD

## 2016-12-18 DIAGNOSIS — Z96641 Presence of right artificial hip joint: Secondary | ICD-10-CM

## 2016-12-18 DIAGNOSIS — M25511 Pain in right shoulder: Secondary | ICD-10-CM

## 2016-12-18 DIAGNOSIS — G8929 Other chronic pain: Secondary | ICD-10-CM

## 2016-12-18 NOTE — Progress Notes (Signed)
The patient is following up for 2 different things. One she is 2 years out to almost 3 years out from a right total hip replacement was done in [place]. She has not had x-rays for some time and this is a visit to have x-rays of that hip. It does hurt with her chronically. However she does have some of her chronic pain syndrome all over her body. I did inject her right shoulder last visit and said that injection was very helpful.  On examination her right hip hurts with flexion-extension is was internal rotation rotation but there is no blocks this at all. Incisions well-healed. Her leg lengths are equal. An AP pelvis shows a well-seated implant on the right side and I see no evidence of loosening or any, getting features at all. It's a nice tight fit and there is no evidence of early wear or osteolysis.  I gave her reassurance that her joint replacement looks fine. I'm not sure what else that she can try from a pain standpoint she's tried everything before. I do feel that taking an over-the-counter supplements such as Tumeric may be helpful. I gave her some information on this. She'll follow-up as needed.

## 2016-12-30 ENCOUNTER — Emergency Department (HOSPITAL_COMMUNITY): Payer: BLUE CROSS/BLUE SHIELD

## 2016-12-30 ENCOUNTER — Emergency Department (HOSPITAL_COMMUNITY)
Admission: EM | Admit: 2016-12-30 | Discharge: 2016-12-31 | Disposition: A | Discharge status: Home / Self Care | Payer: BLUE CROSS/BLUE SHIELD | Attending: Emergency Medicine | Admitting: Emergency Medicine

## 2016-12-30 ENCOUNTER — Encounter (HOSPITAL_COMMUNITY): Payer: Self-pay | Admitting: Emergency Medicine

## 2016-12-30 DIAGNOSIS — Z79899 Other long term (current) drug therapy: Secondary | ICD-10-CM | POA: Diagnosis not present

## 2016-12-30 DIAGNOSIS — M545 Low back pain: Secondary | ICD-10-CM | POA: Diagnosis not present

## 2016-12-30 DIAGNOSIS — M25531 Pain in right wrist: Secondary | ICD-10-CM | POA: Diagnosis not present

## 2016-12-30 DIAGNOSIS — F17210 Nicotine dependence, cigarettes, uncomplicated: Secondary | ICD-10-CM | POA: Diagnosis not present

## 2016-12-30 DIAGNOSIS — I10 Essential (primary) hypertension: Secondary | ICD-10-CM | POA: Insufficient documentation

## 2016-12-30 DIAGNOSIS — M542 Cervicalgia: Secondary | ICD-10-CM | POA: Diagnosis not present

## 2016-12-30 DIAGNOSIS — M546 Pain in thoracic spine: Secondary | ICD-10-CM | POA: Insufficient documentation

## 2016-12-30 DIAGNOSIS — Y999 Unspecified external cause status: Secondary | ICD-10-CM | POA: Diagnosis not present

## 2016-12-30 DIAGNOSIS — Y939 Activity, unspecified: Secondary | ICD-10-CM | POA: Diagnosis not present

## 2016-12-30 DIAGNOSIS — Y92410 Unspecified street and highway as the place of occurrence of the external cause: Secondary | ICD-10-CM | POA: Insufficient documentation

## 2016-12-30 MED ORDER — METHOCARBAMOL 500 MG PO TABS: 500.0000 mg | ORAL_TABLET | Freq: Two times a day (BID) | ORAL | 0 refills | Status: DC

## 2016-12-30 MED ORDER — IBUPROFEN 600 MG PO TABS: 600.0000 mg | ORAL_TABLET | Freq: Four times a day (QID) | ORAL | 0 refills | Status: DC | PRN

## 2016-12-30 NOTE — ED Notes (Signed)
Adjusted patient for better comfort in chair.
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
## 2016-12-30 NOTE — ED Notes (Signed)
Pt transported to radiology.
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
## 2017-05-25 ENCOUNTER — Encounter (HOSPITAL_COMMUNITY): Payer: Self-pay | Admitting: Emergency Medicine

## 2017-05-25 ENCOUNTER — Ambulatory Visit (HOSPITAL_COMMUNITY)
Admission: EM | Admit: 2017-05-25 | Discharge: 2017-05-25 | Disposition: A | Discharge status: Home / Self Care | Payer: Medicare Other | Attending: Family Medicine | Admitting: Family Medicine

## 2017-05-25 DIAGNOSIS — L03211 Cellulitis of face: Secondary | ICD-10-CM | POA: Diagnosis not present

## 2017-05-25 HISTORY — DX: Unspecified osteoarthritis, unspecified site: M19.90

## 2017-05-25 HISTORY — DX: Essential (primary) hypertension: I10

## 2017-05-25 MED ORDER — DOXYCYCLINE HYCLATE 100 MG PO TABS: 100.0000 mg | ORAL_TABLET | Freq: Two times a day (BID) | ORAL | 0 refills | Status: DC

## 2017-05-25 NOTE — ED Provider Notes (Signed)
[facility]    CSN: [id number] Arrival date & time: 05/25/17  1252     History   Chief Complaint Chief Complaint  Patient presents with  . Abscess    HPI Katherine Jarvis is a 56 y.o. female.   Pt here for abscess from possible insect bite to right side of face; swelling and pain noted the problem began 2 days ago and patient has been applying hot compresses and getting a small amount of serosanguineous fluid from the swollen area.  Patient is disabled from past hip surgery.      Past Medical History:  Diagnosis Date  . Arthritis   . Hypertension     Patient Active Problem List   Diagnosis Date Noted  . Back pain 02/01/2013  . Hip pain 02/01/2013  . FACIAL PAIN 11/28/2010  . TOBACCO USER 11/16/2009  . BRONCHITIS 07/17/2009  . ANEMIA, IRON DEFICIENCY 06/01/2009  . HYPERTENSION 06/01/2009    History reviewed. No pertinent surgical history.  OB History    No data available       Home Medications    Prior to Admission medications   Medication Sig Start Date End Date Taking? Authorizing Provider  Biotin 5000 MCG CAPS Take 1 capsule by mouth daily.    [provider]  Calcium-Vitamin D 600-200 MG-UNIT per tablet Take 1 tablet by mouth daily.    [provider]  diclofenac (VOLTAREN) 75 MG EC tablet Take 1 tablet (75 mg total) by mouth 2 (two) times daily between meals as needed. 12/04/16   Mcarthur Rossetti, MD  doxycycline (VIBRA-TABS) 100 MG tablet Take 1 tablet (100 mg total) by mouth 2 (two) times daily. 05/25/17   Robyn Haber, MD  gabapentin (NEURONTIN) 300 MG capsule Take 300 mg by mouth 3 (three) times daily.    [provider]  ibuprofen (ADVIL,MOTRIN) 600 MG tablet Take 1 tablet (600 mg total) by mouth every 6 (six) hours as needed. 12/30/16   Law, Bea Graff, PA-C  losartan-hydrochlorothiazide (HYZAAR) 50-12.5 MG per tablet Take 1 tablet by mouth daily. 02/01/13   Marletta Lor, MD  methocarbamol  (ROBAXIN) 500 MG tablet Take 1 tablet (500 mg total) by mouth 2 (two) times daily. 12/30/16   Law, Bea Graff, PA-C  mupirocin cream (BACTROBAN) 2 % Apply 1 application topically 2 (two) times daily. 05/08/16   Konrad Felix, PA  TRAZODONE HCL PO Take by mouth.    [provider]  zolpidem (AMBIEN) 10 MG tablet Take 10 mg by mouth at bedtime as needed for sleep.    [provider]    Family History History reviewed. No pertinent family history.  Social History Social History  Substance Use Topics  . Smoking status: Current Every Day Smoker    Types: Cigarettes  . Smokeless tobacco: Never Used  . Alcohol use No     Allergies   Lisinopril   Review of Systems Review of Systems  Constitutional: Negative.   HENT: Positive for facial swelling.      Physical Exam Triage Vital Signs ED Triage Vitals [05/25/17 1356]  Enc Vitals Group     BP 139/83     Pulse Rate 63     Resp 18     Temp 98 F (36.7 C)     Temp Source Oral     SpO2 98 %     Weight 146 lb (66.2 kg)     Height 5\' 3"  (1.6 m)  Head Circumference      Peak Flow      Pain Score 10     Pain Loc      Pain Edu?      Excl. in [place]?    No data found.   Updated Vital Signs BP 139/83 (BP Location: Left Arm)   Pulse 63   Temp 98 F (36.7 C) (Oral)   Resp 18   Ht 5\' 3"  (1.6 m)   Wt 146 lb (66.2 kg)   LMP 09/17/1999   SpO2 98%   BMI 25.86 kg/m   Visual Acuity Right Eye Distance:   Left Eye Distance:   Bilateral Distance:    Right Eye Near:   Left Eye Near:    Bilateral Near:     Physical Exam  Constitutional: She is oriented to person, place, and time. She appears well-developed and well-nourished.  HENT:  Right Ear: External ear normal.  Left Ear: External ear normal.  Mouth/Throat: Oropharynx is clear and moist.  Markedly swollen right face with central right cheek puncture site and small amount of crusty serous eschar. There is a moderate amount of induration but no  fluctuance  Eyes: Pupils are equal, round, and reactive to light. Conjunctivae are normal.  Neck: Normal range of motion. Neck supple.  Pulmonary/Chest: Effort normal.  Musculoskeletal: Normal range of motion.  Neurological: She is alert and oriented to person, place, and time.  Skin: Skin is warm and dry.  Nursing note and vitals reviewed.    UC Treatments / Results  Labs (all labs ordered are listed, but only abnormal results are displayed) Labs Reviewed - No data to display  EKG  EKG Interpretation None       Radiology No results found.  Procedures Procedures (including critical care time)  Medications Ordered in UC Medications - No data to display   Initial Impression / Assessment and Plan / UC Course  I have reviewed the triage vital signs and the nursing notes.  Pertinent labs & imaging results that were available during my care of the patient were reviewed by me and considered in my medical decision making (see chart for details).     Final Clinical Impressions(s) / UC Diagnoses   Final diagnoses:  Facial cellulitis    New Prescriptions New Prescriptions   DOXYCYCLINE (VIBRA-TABS) 100 MG TABLET    Take 1 tablet (100 mg total) by mouth 2 (two) times daily.     Controlled Substance Prescriptions [place] Controlled Substance Registry consulted? Not Applicable   Robyn Haber, MD 05/25/17 1419

## 2017-05-25 NOTE — ED Triage Notes (Signed)
Pt here for abscess from possible insect bite to right side of face; swelling and pain noted

## 2017-05-25 NOTE — Discharge Instructions (Signed)
You will need to continue using hot packs at least 3 times a day. Get on the antibiotics as prescribed. I would like you to follow-up with your doctor or come back here in 2 days for recheck

## 2017-10-15 ENCOUNTER — Ambulatory Visit (HOSPITAL_COMMUNITY)
Admission: EM | Admit: 2017-10-15 | Discharge: 2017-10-15 | Disposition: A | Discharge status: Home / Self Care | Payer: Medicare Other | Attending: Family Medicine | Admitting: Family Medicine

## 2017-10-15 ENCOUNTER — Encounter (HOSPITAL_COMMUNITY): Payer: Self-pay | Admitting: Family Medicine

## 2017-10-15 DIAGNOSIS — J111 Influenza due to unidentified influenza virus with other respiratory manifestations: Secondary | ICD-10-CM

## 2017-10-15 DIAGNOSIS — R062 Wheezing: Secondary | ICD-10-CM

## 2017-10-15 MED ORDER — PREDNISONE 10 MG (21) PO TBPK: ORAL_TABLET | ORAL | 0 refills | Status: DC

## 2017-10-15 MED ORDER — HYDROCODONE-HOMATROPINE 5-1.5 MG/5ML PO SYRP: 5.0000 mL | ORAL_SOLUTION | Freq: Four times a day (QID) | ORAL | 0 refills | Status: DC | PRN

## 2017-10-15 MED ORDER — OSELTAMIVIR PHOSPHATE 75 MG PO CAPS: 75.0000 mg | ORAL_CAPSULE | Freq: Two times a day (BID) | ORAL | 0 refills | Status: DC

## 2017-10-15 NOTE — Discharge Instructions (Signed)
Be aware, your cough medication may cause drowsiness. Please do not drive, operate heavy machinery or make important decisions while on this medication, it can cloud your judgement.  Follow up with your primary care doctor or here if you are not seeing improvement of your symptoms over the next several days.  Caring for yourself: Get plenty of rest. Drink plenty of fluids, enough so that your urine is light yellow or clear like water. If you have kidney, heart, or liver disease and have to limit fluids, talk with your doctor before you increase the amount of fluids you drink. Take an over-the-counter pain medicine if needed, such as acetaminophen (Tylenol), ibuprofen (Advil, Motrin), or naproxen (Aleve), to relieve fever, headache, and muscle aches. Read and follow all instructions on the label. No one younger than 20 should take aspirin. It has been linked to Reye syndrome, a serious illness. Before you use over the counter cough and cold medicines, check the label. These medicines may not be safe for children younger than age 25 or for people with certain health problems. If the skin around your nose and lips becomes sore, put some petroleum jelly on the area.  Avoid spreading the flu: Wash your hands regularly, and keep your hands away from your face.  Stay home from school, work, and other public places until you are feeling better and your fever has been gone for at least 24 hours. The fever needs to have gone away on its own without the help of medicine.

## 2017-10-15 NOTE — ED Triage Notes (Signed)
Pt here for URI symptoms since yesterday.

## 2017-10-18 NOTE — ED Provider Notes (Signed)
  [employer]   [id number] 10/15/17 Arrival Time: [place]:  1. Influenza   2. Wheezing     Meds ordered this encounter  Medications  . oseltamivir (TAMIFLU) 75 MG capsule    Sig: Take 1 capsule (75 mg total) by mouth every 12 (twelve) hours.    Dispense:  10 capsule    Refill:  0  . HYDROcodone-homatropine (HYCODAN) 5-1.5 MG/5ML syrup    Sig: Take 5 mLs by mouth every 6 (six) hours as needed for cough.    Dispense:  90 mL    Refill:  0  . predniSONE (STERAPRED UNI-PAK 21 TAB) 10 MG (21) TBPK tablet    Sig: Take as directed.    Dispense:  21 tablet    Refill:  0    Medication sedation precautions. Will f/u if not seeing improvement over the next several days, sooner if needed. OTC symptom care as needed. Ensure adequate fluid intake and rest. May f/u with PCP or here as needed.  Reviewed expectations re: course of current medical issues. Questions answered. Outlined signs and symptoms indicating need for more acute intervention. Patient verbalized understanding. After Visit Summary given.   SUBJECTIVE: History from: patient.  Katherine Jarvis is a 57 y.o. female who presents with complaint of nasal congestion, post-nasal drainage, and a persistent dry cough. Onset abrupt, approximately 1-2 days ago. Overall fatigued with body aches. SOB: none. Wheezing: questions at times. Fever: yes, subjective. Overall normal PO intake without n/v. Sick contacts: no. OTC treatment: cough med without relief.  Received flu shot this year: no. Social History   Tobacco Use  Smoking Status Current Every Day Smoker  . Types: Cigarettes  Smokeless Tobacco Never Used    ROS: As per HPI.   OBJECTIVE:  Vitals:   10/15/17 2006  BP: (!) 158/102  Pulse: 62  Resp: 18  Temp: 98.9 F (37.2 C)  SpO2: 98%     General appearance: alert; appears fatigued HEENT: nasal congestion; clear runny nose; throat irritation secondary to post-nasal drainage Neck: supple  without LAD Lungs: clear to auscultation bilaterally except for some expiratory wheezing; cough: moderate; no respiratory distress Skin: warm and dry Psychological: alert and cooperative; normal mood and affect    Allergies  Allergen Reactions  . Lisinopril     REACTION: cough    Past Medical History:  Diagnosis Date  . Arthritis   . Hypertension     Social History   Socioeconomic History  . Marital status: Single    Spouse name: Not on file  . Number of children: Not on file  . Years of education: Not on file  . Highest education level: Not on file  Social Needs  . Financial resource strain: Not on file  . Food insecurity - worry: Not on file  . Food insecurity - inability: Not on file  . Transportation needs - medical: Not on file  . Transportation needs - non-medical: Not on file  Occupational History  . Not on file  Tobacco Use  . Smoking status: Current Every Day Smoker    Types: Cigarettes  . Smokeless tobacco: Never Used  Substance and Sexual Activity  . Alcohol use: No  . Drug use: No  . Sexual activity: Not on file  Other Topics Concern  . Not on file  Social History Narrative  . Not on file           Vanessa Kick, MD 10/18/17 1301
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
## 2018-02-24 DIAGNOSIS — R7303 Prediabetes: Secondary | ICD-10-CM | POA: Insufficient documentation

## 2018-07-22 DIAGNOSIS — J449 Chronic obstructive pulmonary disease, unspecified: Secondary | ICD-10-CM | POA: Insufficient documentation

## 2019-02-12 DIAGNOSIS — L2089 Other atopic dermatitis: Secondary | ICD-10-CM | POA: Insufficient documentation

## 2019-06-17 ENCOUNTER — Encounter (HOSPITAL_COMMUNITY): Payer: Self-pay | Admitting: Emergency Medicine

## 2019-06-17 ENCOUNTER — Ambulatory Visit (HOSPITAL_COMMUNITY)
Admission: EM | Admit: 2019-06-17 | Discharge: 2019-06-17 | Disposition: A | Discharge status: Home / Self Care | Payer: Medicare Other | Attending: Internal Medicine | Admitting: Internal Medicine

## 2019-06-17 ENCOUNTER — Other Ambulatory Visit: Payer: Self-pay

## 2019-06-17 DIAGNOSIS — M62838 Other muscle spasm: Secondary | ICD-10-CM

## 2019-06-17 MED ORDER — CYCLOBENZAPRINE HCL 10 MG PO TABS: 5.0000 mg | ORAL_TABLET | Freq: Every day | ORAL | 0 refills | Status: DC

## 2019-06-17 MED ORDER — IBUPROFEN 600 MG PO TABS: 600.0000 mg | ORAL_TABLET | Freq: Four times a day (QID) | ORAL | 0 refills | Status: DC | PRN

## 2019-06-17 NOTE — ED Provider Notes (Signed)
[place]    CSN: [id number] Arrival date & time: 06/17/19  1236      History   Chief Complaint Chief Complaint  Patient presents with  . Neck Pain    HPI Katherine Jarvis is a 58 y.o. female.   Pt is a 58 year old female with PMH arthritis and HTN. Katherine Jarvis presents today with 3 days of left lateral neck pain. This pain is worse with movement and turning the neck. The pain is constant. Describes as aching and tight.  Katherine Jarvis has good ROM of the neck. No swelling, fever. No injures, falls, or heavy lifting. Denies numbness, tingling or radiation of pain in the arms. No chest pain, SOB. Katherine Jarvis has been using heat on the neck which helps.   ROS per HPI      Past Medical History:  Diagnosis Date  . Arthritis   . Hypertension     Patient Active Problem List   Diagnosis Date Noted  . Back pain 02/01/2013  . Hip pain 02/01/2013  . FACIAL PAIN 11/28/2010  . TOBACCO USER 11/16/2009  . BRONCHITIS 07/17/2009  . ANEMIA, IRON DEFICIENCY 06/01/2009  . HYPERTENSION 06/01/2009    History reviewed. No pertinent surgical history.  OB History   No obstetric history on file.      Home Medications    Prior to Admission medications   Medication Sig Start Date End Date Taking? Authorizing Provider  Biotin 5000 MCG CAPS Take 1 capsule by mouth daily.    [provider]  Calcium-Vitamin D 600-200 MG-UNIT per tablet Take 1 tablet by mouth daily.    [provider]  cyclobenzaprine (FLEXERIL) 10 MG tablet Take 0.5 tablets (5 mg total) by mouth at bedtime. 06/17/19   Loura Halt A, NP  gabapentin (NEURONTIN) 300 MG capsule Take 300 mg by mouth 3 (three) times daily.    [provider]  ibuprofen (ADVIL) 600 MG tablet Take 1 tablet (600 mg total) by mouth every 6 (six) hours as needed. 06/17/19   Azarie Coriz, Tressia Miners A, NP  losartan-hydrochlorothiazide (HYZAAR) 50-12.5 MG per tablet Take 1 tablet by mouth daily. 02/01/13   Marletta Lor, MD  mupirocin cream  (BACTROBAN) 2 % Apply 1 application topically 2 (two) times daily. 05/08/16   Konrad Felix, PA  TRAZODONE HCL PO Take by mouth.    [provider]  zolpidem (AMBIEN) 10 MG tablet Take 10 mg by mouth at bedtime as needed for sleep.    [provider]    Family History History reviewed. No pertinent family history.  Social History Social History   Tobacco Use  . Smoking status: Current Every Day Smoker    Types: Cigarettes  . Smokeless tobacco: Never Used  Substance Use Topics  . Alcohol use: No  . Drug use: No     Allergies   Lisinopril   Review of Systems Review of Systems   Physical Exam Triage Vital Signs ED Triage Vitals [06/17/19 1248]  Enc Vitals Group     BP 127/74     Pulse Rate 85     Resp 18     Temp 98.2 F (36.8 C)     Temp Source Oral     SpO2 96 %     Weight      Height      Head Circumference      Peak Flow      Pain Score 8     Pain Loc  Pain Edu?      Excl. in [place]?    No data found.  Updated Vital Signs BP 127/74 (BP Location: Right Arm)   Pulse 85   Temp 98.2 F (36.8 C) (Oral)   Resp 18   LMP 09/17/1999   SpO2 96%   Visual Acuity Right Eye Distance:   Left Eye Distance:   Bilateral Distance:    Right Eye Near:   Left Eye Near:    Bilateral Near:     Physical Exam Vitals signs and nursing note reviewed.  Constitutional:      General: Katherine Jarvis is not in acute distress.    Appearance: Normal appearance. Katherine Jarvis is not ill-appearing, toxic-appearing or diaphoretic.  HENT:     Head: Normocephalic.     Nose: Nose normal.     Mouth/Throat:     Pharynx: Oropharynx is clear.  Eyes:     Conjunctiva/sclera: Conjunctivae normal.  Neck:     Musculoskeletal: Normal range of motion.      Comments: Mild TTP of the left lateral neck and trapezius. Good ROM of the neck. No bruising swelling or deformity.  Pulmonary:     Effort: Pulmonary effort is normal.  Musculoskeletal: Normal range of motion.  Skin:     General: Skin is warm and dry.     Findings: No rash.  Neurological:     Mental Status: Katherine Jarvis is alert.  Psychiatric:        Mood and Affect: Mood normal.      UC Treatments / Results  Labs (all labs ordered are listed, but only abnormal results are displayed) Labs Reviewed - No data to display  EKG   Radiology No results found.  Procedures Procedures (including critical care time)  Medications Ordered in UC Medications - No data to display  Initial Impression / Assessment and Plan / UC Course  I have reviewed the triage vital signs and the nursing notes.  Pertinent labs & imaging results that were available during my care of the patient were reviewed by me and considered in my medical decision making (see chart for details).     Neck pain- treating with low dose muscle relaxant and ibuprofen for the pain and inflammation.  Gentle heat, massage.  Follow up as needed for continued or worsening symptoms  Final Clinical Impressions(s) / UC Diagnoses   Final diagnoses:  Muscle spasms of neck     Discharge Instructions     Take the medication as prescribed.  Be aware of the Flexeril may make you drowsy. Apply heat to the area  Gentle stretching and massage. Follow up as needed for continued or worsening symptoms     ED Prescriptions    Medication Sig Dispense Auth. Provider   cyclobenzaprine (FLEXERIL) 10 MG tablet Take 0.5 tablets (5 mg total) by mouth at bedtime. 20 tablet Josepha Barbier A, NP   ibuprofen (ADVIL) 600 MG tablet Take 1 tablet (600 mg total) by mouth every 6 (six) hours as needed. 30 tablet Loura Halt A, NP     Controlled Substance Prescriptions [place] Controlled Substance Registry consulted? Not Applicable   Orvan July, NP 06/17/19 1319

## 2019-06-17 NOTE — Discharge Instructions (Addendum)
Take the medication as prescribed.  Be aware of the Flexeril may make you drowsy. Apply heat to the area  Gentle stretching and massage. Follow up as needed for continued or worsening symptoms

## 2019-06-17 NOTE — ED Triage Notes (Signed)
Pt here for left sided neck pain x 3 days that feels like tight muscle per pt

## 2019-06-20 ENCOUNTER — Ambulatory Visit (HOSPITAL_COMMUNITY)
Admission: EM | Admit: 2019-06-20 | Discharge: 2019-06-20 | Disposition: A | Discharge status: Home / Self Care | Payer: Medicare Other | Attending: Family Medicine | Admitting: Family Medicine

## 2019-06-20 ENCOUNTER — Encounter (HOSPITAL_COMMUNITY): Payer: Self-pay | Admitting: *Deleted

## 2019-06-20 ENCOUNTER — Other Ambulatory Visit: Payer: Self-pay

## 2019-06-20 DIAGNOSIS — I10 Essential (primary) hypertension: Secondary | ICD-10-CM | POA: Diagnosis not present

## 2019-06-20 DIAGNOSIS — S46812A Strain of other muscles, fascia and tendons at shoulder and upper arm level, left arm, initial encounter: Secondary | ICD-10-CM | POA: Diagnosis not present

## 2019-06-20 DIAGNOSIS — S161XXA Strain of muscle, fascia and tendon at neck level, initial encounter: Secondary | ICD-10-CM

## 2019-06-20 MED ORDER — TIZANIDINE HCL 4 MG PO TABS: 4.0000 mg | ORAL_TABLET | Freq: Three times a day (TID) | ORAL | 0 refills | Status: DC

## 2019-06-20 MED ORDER — KETOROLAC TROMETHAMINE 60 MG/2ML IM SOLN
INTRAMUSCULAR | Status: AC
  Filled 2019-06-20: qty 2

## 2019-06-20 MED ORDER — DEXAMETHASONE SODIUM PHOSPHATE 10 MG/ML IJ SOLN
INTRAMUSCULAR | Status: AC
  Filled 2019-06-20: qty 1

## 2019-06-20 MED ORDER — KETOROLAC TROMETHAMINE 60 MG/2ML IM SOLN
60.0000 mg | Freq: Once | INTRAMUSCULAR | Status: AC
  Administered 2019-06-20: 60 mg via INTRAMUSCULAR

## 2019-06-20 MED ORDER — NAPROXEN 500 MG PO TABS: 500.0000 mg | ORAL_TABLET | Freq: Two times a day (BID) | ORAL | 0 refills | Status: DC

## 2019-06-20 MED ORDER — DEXAMETHASONE SODIUM PHOSPHATE 10 MG/ML IJ SOLN
10.0000 mg | Freq: Once | INTRAMUSCULAR | Status: AC
  Administered 2019-06-20: 13:00:00 10 mg via INTRAMUSCULAR

## 2019-06-20 NOTE — Discharge Instructions (Signed)
We gave you a shot of Toradol and Decadron today. Pain seems to still be muscular based off your exam. Continue with Naprosyn twice daily with food May supplement with Zanaflex as needed.  May cause drowsiness.  Do not drive or work after taking. Alternate ice and heat Gentle stretching of neck/left shoulder  Follow-up if symptoms changing, developing more sore throat, difficulty swallowing, increased pain, headaches, vision changes, dizziness or lightheadedness, fevers

## 2019-06-20 NOTE — ED Triage Notes (Signed)
C/O left lateral neck swelling and tenderness since waking this AM.  Denies fever or sore throat.
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
## 2019-09-01 DIAGNOSIS — M4310 Spondylolisthesis, site unspecified: Secondary | ICD-10-CM | POA: Insufficient documentation

## 2019-09-01 DIAGNOSIS — M5416 Radiculopathy, lumbar region: Secondary | ICD-10-CM | POA: Insufficient documentation

## 2019-09-01 DIAGNOSIS — M2020 Hallux rigidus, unspecified foot: Secondary | ICD-10-CM | POA: Insufficient documentation

## 2019-09-17 ENCOUNTER — Ambulatory Visit (AMBULATORY_SURGERY_CENTER): Payer: Medicare Other | Admitting: *Deleted

## 2019-09-17 ENCOUNTER — Other Ambulatory Visit: Payer: Self-pay

## 2019-09-17 ENCOUNTER — Encounter: Payer: Self-pay | Admitting: Gastroenterology

## 2019-09-17 ENCOUNTER — Ambulatory Visit (INDEPENDENT_AMBULATORY_CARE_PROVIDER_SITE_OTHER): Payer: Self-pay

## 2019-09-17 VITALS — Temp 97.7°F | Ht 63.0 in | Wt 159.0 lb

## 2019-09-17 DIAGNOSIS — Z1159 Encounter for screening for other viral diseases: Secondary | ICD-10-CM

## 2019-09-17 DIAGNOSIS — Z800 Family history of malignant neoplasm of digestive organs: Secondary | ICD-10-CM

## 2019-09-17 DIAGNOSIS — Z1211 Encounter for screening for malignant neoplasm of colon: Secondary | ICD-10-CM

## 2019-09-17 NOTE — Progress Notes (Signed)
Patient is here in-person for PV. Patient denies any allergies to eggs or soy. Patient denies any problems with anesthesia/sedation. Patient denies any oxygen use at home. Patient denies taking any diet/weight loss medications or blood thinners. Patient is not being treated for MRSA or C-diff. EMMI education assisgned to the patient for the procedure, this was explained and instructions given to patient. COVID-19 screening test is on 12/4 at 315 pm, the pt is aware. Pt is aware that care partner will wait in the car during procedure; if they feel like they will be too hot or cold to wait in the car; they may wait in the 4 th floor lobby. Patient is aware to bring only one care partner. We want them to wear a mask (we do not have any that we can provide them), practice social distancing, and we will check their temperatures when they get here.  I did remind the patient that their care partner needs to stay in the parking lot the entire time and have a cell phone available, we will call them when the pt is ready for discharge. Patient will wear mask into building.

## 2019-09-20 LAB — SARS CORONAVIRUS 2 (TAT 6-24 HRS): SARS Coronavirus 2: NEGATIVE

## 2019-09-21 ENCOUNTER — Ambulatory Visit (AMBULATORY_SURGERY_CENTER): Payer: Medicare Other | Admitting: Gastroenterology

## 2019-09-21 ENCOUNTER — Encounter: Payer: Self-pay | Admitting: Gastroenterology

## 2019-09-21 ENCOUNTER — Other Ambulatory Visit: Payer: Self-pay

## 2019-09-21 VITALS — BP 184/80 | HR 62 | Temp 97.8°F | Resp 17 | Ht 63.0 in | Wt 159.0 lb

## 2019-09-21 DIAGNOSIS — K635 Polyp of colon: Secondary | ICD-10-CM | POA: Diagnosis not present

## 2019-09-21 DIAGNOSIS — Z1211 Encounter for screening for malignant neoplasm of colon: Secondary | ICD-10-CM

## 2019-09-21 DIAGNOSIS — D125 Benign neoplasm of sigmoid colon: Secondary | ICD-10-CM

## 2019-09-21 MED ORDER — SODIUM CHLORIDE 0.9 % IV SOLN: 500.0000 mL | INTRAVENOUS | Status: DC

## 2019-09-21 NOTE — Progress Notes (Signed)
Called to room to assist during endoscopic procedure.  Patient ID and intended procedure confirmed with present staff. Received instructions for my participation in the procedure from the performing physician.  

## 2019-09-21 NOTE — Progress Notes (Signed)
To PACU, VSS. Report to [email or web address] 

## 2019-09-21 NOTE — Op Note (Signed)
[facility] Patient Name: Katherine Jarvis Procedure Date: 09/21/2019 9:26 AM MRN: [id number] Endoscopist: Napoleon Form , MD Age: 58 Referring MD:  Date of Birth: 06-30-1961 Gender: Female Account #: [id number] Procedure:                Colonoscopy Indications:              Screening for colorectal malignant neoplasm Medicines:                Monitored Anesthesia Care Procedure:                Pre-Anesthesia Assessment:                           - Prior to the procedure, a History and Physical                            was performed, and patient medications and                            allergies were reviewed. The patient's tolerance of                            previous anesthesia was also reviewed. The risks                            and benefits of the procedure and the sedation                            options and risks were discussed with the patient.                            All questions were answered, and informed consent                            was obtained. Prior Anticoagulants: The patient has                            taken no previous anticoagulant or antiplatelet                            agents. ASA Grade Assessment: II - A patient with                            mild systemic disease. After reviewing the risks                            and benefits, the patient was deemed in                            satisfactory condition to undergo the procedure.                           After obtaining informed consent, the colonoscope  was passed under direct vision. Throughout the                            procedure, the patient's blood pressure, pulse, and                            oxygen saturations were monitored continuously. The                            Colonoscope was introduced through the anus and                            advanced to the the cecum, identified by                            appendiceal orifice  and ileocecal valve. The                            colonoscopy was performed without difficulty. The                            patient tolerated the procedure well. The quality                            of the bowel preparation was excellent. The                            ileocecal valve, appendiceal orifice, and rectum                            were photographed. Scope In: 9:32:30 AM Scope Out: 9:48:25 AM Scope Withdrawal Time: 0 hours 12 minutes 59 seconds  Total Procedure Duration: 0 hours 15 minutes 55 seconds  Findings:                 The perianal and digital rectal examinations were                            normal.                           A less than 1 mm polyp was found in the sigmoid                            colon. The polyp was sessile. The polyp was removed                            with a cold biopsy forceps. Resection and retrieval                            were complete.                           Non-bleeding internal hemorrhoids were found during  retroflexion. The hemorrhoids were small.                           The exam was otherwise without abnormality. Complications:            No immediate complications. Estimated Blood Loss:     Estimated blood loss was minimal. Impression:               - One less than 1 mm polyp in the sigmoid colon,                            removed with a cold biopsy forceps. Resected and                            retrieved.                           - Non-bleeding internal hemorrhoids.                           - The examination was otherwise normal. Recommendation:           - Patient has a contact number available for                            emergencies. The signs and symptoms of potential                            delayed complications were discussed with the                            patient. Return to normal activities tomorrow.                            Written discharge instructions were  provided to the                            patient.                           - Resume previous diet.                           - Continue present medications.                           - Await pathology results.                           - Repeat colonoscopy in 5-10 years for surveillance                            based on pathology results. Napoleon Form, MD 09/21/2019 9:53:27 AM This report has been signed electronically.

## 2019-09-21 NOTE — Progress Notes (Signed)
Pt's states no medical or surgical changes since previsit or office visit.  Cw vitals, LC temp and SM IV.

## 2019-09-21 NOTE — Patient Instructions (Signed)
Information on polyps and hemorrhoids given to you today.  Await pathology results.  YOU HAD AN ENDOSCOPIC PROCEDURE TODAY AT THE [facility]:   Refer to the procedure report that was given to you for any specific questions about what was found during the examination.  If the procedure report does not answer your questions, please call your gastroenterologist to clarify.  If you requested that your care partner not be given the details of your procedure findings, then the procedure report has been included in a sealed envelope for you to review at your convenience later.  YOU SHOULD EXPECT: Some feelings of bloating in the abdomen. Passage of more gas than usual.  Walking can help get rid of the air that was put into your GI tract during the procedure and reduce the bloating. If you had a lower endoscopy (such as a colonoscopy or flexible sigmoidoscopy) you may notice spotting of blood in your stool or on the toilet paper. If you underwent a bowel prep for your procedure, you may not have a normal bowel movement for a few days.  Please Note:  You might notice some irritation and congestion in your nose or some drainage.  This is from the oxygen used during your procedure.  There is no need for concern and it should clear up in a day or so.  SYMPTOMS TO REPORT IMMEDIATELY:   Following lower endoscopy (colonoscopy or flexible sigmoidoscopy):  Excessive amounts of blood in the stool  Significant tenderness or worsening of abdominal pains  Swelling of the abdomen that is new, acute  Fever of 100F or higher   For urgent or emergent issues, a gastroenterologist can be reached at any hour by calling [phone].   DIET:  We do recommend a small meal at first, but then you may proceed to your regular diet.  Drink plenty of fluids but you should avoid alcoholic beverages for 24 hours.  ACTIVITY:  You should plan to take it easy for the rest of today and you should NOT DRIVE or use  heavy machinery until tomorrow (because of the sedation medicines used during the test).    FOLLOW UP: Our staff will call the number listed on your records 48-72 hours following your procedure to check on you and address any questions or concerns that you may have regarding the information given to you following your procedure. If we do not reach you, we will leave a message.  We will attempt to reach you two times.  During this call, we will ask if you have developed any symptoms of COVID 19. If you develop any symptoms (ie: fever, flu-like symptoms, shortness of breath, cough etc.) before then, please call [phone].  If you test positive for Covid 19 in the 2 weeks post procedure, please call and report this information to us.    If any biopsies were taken you will be contacted by phone or by letter within the next 1-3 weeks.  Please call us at [phone] if you have not heard about the biopsies in 3 weeks.    SIGNATURES/CONFIDENTIALITY: You and/or your care partner have signed paperwork which will be entered into your electronic medical record.  These signatures attest to the fact that that the information above on your After Visit Summary has been reviewed and is understood.  Full responsibility of the confidentiality of this discharge information lies with you and/or your care-partner. 

## 2019-09-23 ENCOUNTER — Telehealth: Payer: Self-pay | Admitting: *Deleted

## 2019-09-23 NOTE — Telephone Encounter (Signed)
1. Have you developed a fever since your procedure? no  2.   Have you had an respiratory symptoms (SOB or cough) since your procedure? no  3.   Have you tested positive for COVID 19 since your procedure no  4.   Have you had any family members/close contacts diagnosed with the COVID 19 since your procedure?  no   If yes to any of these questions please route to Joylene John, RN and Alphonsa Gin, Therapist, sports.  Follow up Call-  Call back number 09/21/2019  Post procedure Call Back phone  # [phone]  Permission to leave phone message Yes  Some recent data might be hidden     Patient questions:  Do you have a fever, pain , or abdominal swelling? No. Pain Score  0 *  Have you tolerated food without any problems? Yes.    Have you been able to return to your normal activities? Yes.    Do you have any questions about your discharge instructions: Diet   No. Medications  No. Follow up visit  No.  Do you have questions or concerns about your Care? No.  Actions: * If pain score is 4 or above: No action needed, pain <4.

## 2019-09-24 ENCOUNTER — Encounter: Payer: Self-pay | Admitting: Gastroenterology

## 2019-09-25 ENCOUNTER — Encounter (HOSPITAL_COMMUNITY): Payer: Self-pay | Admitting: Emergency Medicine

## 2019-09-25 ENCOUNTER — Other Ambulatory Visit: Payer: Self-pay

## 2019-09-25 ENCOUNTER — Emergency Department (HOSPITAL_COMMUNITY)
Admission: EM | Admit: 2019-09-25 | Discharge: 2019-09-25 | Disposition: A | Discharge status: Home / Self Care | Payer: Medicare Other | Attending: Emergency Medicine | Admitting: Emergency Medicine

## 2019-09-25 DIAGNOSIS — N12 Tubulo-interstitial nephritis, not specified as acute or chronic: Secondary | ICD-10-CM | POA: Insufficient documentation

## 2019-09-25 DIAGNOSIS — Z79899 Other long term (current) drug therapy: Secondary | ICD-10-CM | POA: Diagnosis not present

## 2019-09-25 DIAGNOSIS — J449 Chronic obstructive pulmonary disease, unspecified: Secondary | ICD-10-CM | POA: Diagnosis not present

## 2019-09-25 DIAGNOSIS — F17210 Nicotine dependence, cigarettes, uncomplicated: Secondary | ICD-10-CM | POA: Insufficient documentation

## 2019-09-25 DIAGNOSIS — I10 Essential (primary) hypertension: Secondary | ICD-10-CM | POA: Diagnosis not present

## 2019-09-25 DIAGNOSIS — R300 Dysuria: Secondary | ICD-10-CM | POA: Diagnosis present

## 2019-09-25 LAB — CBC WITH DIFFERENTIAL/PLATELET
Abs Immature Granulocytes: 0.02 10*3/uL (ref 0.00–0.07)
Basophils Absolute: 0 10*3/uL (ref 0.0–0.1)
Basophils Relative: 0 %
Eosinophils Absolute: 0.1 10*3/uL (ref 0.0–0.5)
Eosinophils Relative: 1 %
HCT: 40 % (ref 36.0–46.0)
Hemoglobin: 11.9 g/dL — ABNORMAL LOW (ref 12.0–15.0)
Immature Granulocytes: 0 %
Lymphocytes Relative: 44 %
Lymphs Abs: 3.4 10*3/uL (ref 0.7–4.0)
MCH: 21.4 pg — ABNORMAL LOW (ref 26.0–34.0)
MCHC: 29.8 g/dL — ABNORMAL LOW (ref 30.0–36.0)
MCV: 72.1 fL — ABNORMAL LOW (ref 80.0–100.0)
Monocytes Absolute: 0.5 10*3/uL (ref 0.1–1.0)
Monocytes Relative: 7 %
Neutro Abs: 3.6 10*3/uL (ref 1.7–7.7)
Neutrophils Relative %: 48 %
Platelets: 268 10*3/uL (ref 150–400)
RBC: 5.55 MIL/uL — ABNORMAL HIGH (ref 3.87–5.11)
RDW: 14.9 % (ref 11.5–15.5)
WBC: 7.6 10*3/uL (ref 4.0–10.5)
nRBC: 0 % (ref 0.0–0.2)

## 2019-09-25 LAB — BASIC METABOLIC PANEL WITH GFR
Anion gap: 10 (ref 5–15)
BUN: 13 mg/dL (ref 6–20)
CO2: 27 mmol/L (ref 22–32)
Calcium: 9 mg/dL (ref 8.9–10.3)
Chloride: 104 mmol/L (ref 98–111)
Creatinine, Ser: 0.56 mg/dL (ref 0.44–1.00)
GFR calc Af Amer: >60 mL/min
GFR calc non Af Amer: >60 mL/min
Glucose, Bld: 93 mg/dL (ref 70–99)
Potassium: 3.7 mmol/L (ref 3.5–5.1)
Sodium: 141 mmol/L (ref 135–145)

## 2019-09-25 LAB — URINALYSIS, ROUTINE W REFLEX MICROSCOPIC
Bilirubin Urine: NEGATIVE
Glucose, UA: NEGATIVE mg/dL
Ketones, ur: NEGATIVE mg/dL
Nitrite: NEGATIVE
Protein, ur: 30 mg/dL — AB
Specific Gravity, Urine: 1.017 (ref 1.005–1.030)
WBC, UA: >50 WBC/hpf — ABNORMAL HIGH (ref 0–5)
pH: 6 (ref 5.0–8.0)

## 2019-09-25 MED ORDER — NAPROXEN 500 MG PO TABS: 500.0000 mg | ORAL_TABLET | Freq: Two times a day (BID) | ORAL | 0 refills | Status: DC

## 2019-09-25 MED ORDER — SODIUM CHLORIDE 0.9 % IV BOLUS
1000.0000 mL | Freq: Once | INTRAVENOUS | Status: AC
  Administered 2019-09-25: 11:00:00 1000 mL via INTRAVENOUS

## 2019-09-25 MED ORDER — SODIUM CHLORIDE 0.9 % IV SOLN
1.0000 g | Freq: Once | INTRAVENOUS | Status: AC
  Administered 2019-09-25: 11:00:00 1 g via INTRAVENOUS
  Filled 2019-09-25: qty 10

## 2019-09-25 MED ORDER — CEPHALEXIN 500 MG PO CAPS: 500.0000 mg | ORAL_CAPSULE | Freq: Four times a day (QID) | ORAL | 0 refills | Status: DC

## 2019-09-25 NOTE — ED Triage Notes (Signed)
Pt c/o odor with urination and dysuria for week.

## 2019-09-25 NOTE — ED Provider Notes (Signed)
[facility] DEPT Provider Note   CSN: [id number] Arrival date & time: 09/25/19  0945     History Chief Complaint  Patient presents with  . Dysuria  . odor with urine    Katherine Jarvis is a 58 y.o. female with a history of hypertension, COPD, anxiety, sickle cell trait, and anemia who presents to the emergency department with complaints of dysuria for the past 1 week. Patient states that she is having dysuria with urgency and frequency as well as some suprapubic abdominal pressure. She states over the past few days she has also developed some lower back pain R>L that is constant and achy in nature.  She thinks she has a urinary tract infection.  She denies fever, chills, nausea, vomiting, vaginal bleeding, or vaginal discharge.  Denies numbness, tingling, weakness, incontinence, prior IVDU, or prior cancer.  HPI     Past Medical History:  Diagnosis Date  . Anxiety   . Arthritis   . Blood transfusion without reported diagnosis   . COPD (chronic obstructive pulmonary disease) ([place])   . Hypertension   . Sickle cell trait [facility])     Patient Active Problem List   Diagnosis Date Noted  . Acquired hallux rigidus 09/01/2019  . Lumbar radiculopathy 09/01/2019  . Spondylolisthesis 09/01/2019  . Other atopic dermatitis 02/12/2019  . COPD (chronic obstructive pulmonary disease) ([place]) 07/22/2018  . Prediabetes 02/24/2018  . Vitamin D deficiency 10/22/2016  . Primary insomnia 10/20/2015  . Moderate episode of recurrent major depressive disorder ([place]) 07/05/2015  . Subclinical hyperthyroidism 07/05/2015  . Bilateral carpal tunnel syndrome 05/31/2015  . Primary osteoarthritis involving multiple joints 05/31/2015  . Chronic low back pain without sciatica 05/02/2015  . Cigarette nicotine dependence without complication [id number]  . Primary osteoarthritis of hip 05/02/2015  . Back pain 02/01/2013  . Hip pain 02/01/2013  . FACIAL PAIN 11/28/2010  .  TOBACCO USER 11/16/2009  . BRONCHITIS 07/17/2009  . ANEMIA, IRON DEFICIENCY 06/01/2009  . HYPERTENSION 06/01/2009    Past Surgical History:  Procedure Laterality Date  . ABDOMINAL HYSTERECTOMY    . COLONOSCOPY  2007 and 2014  . FOOT SURGERY    . JOINT REPLACEMENT    . ROTATOR CUFF REPAIR    . TOTAL HIP ARTHROPLASTY       OB History   No obstetric history on file.     Family History  Problem Relation Age of Onset  . Cancer Mother   . Colon cancer Mother 72  . Colon polyps Neg Hx   . Esophageal cancer Neg Hx   . Rectal cancer Neg Hx   . Stomach cancer Neg Hx     Social History   Tobacco Use  . Smoking status: Current Every Day Smoker    Packs/day: 0.50    Types: Cigarettes  . Smokeless tobacco: Never Used  Substance Use Topics  . Alcohol use: Yes    Comment: occasionally-wine  . Drug use: Not Currently    Comment: not for many years    Home Medications Prior to Admission medications   Medication Sig Start Date End Date Taking? Authorizing Provider  Biotin 5000 MCG CAPS Take 1 capsule by mouth daily.    [provider]  cyclobenzaprine (FLEXERIL) 10 MG tablet Take 0.5 tablets (5 mg total) by mouth at bedtime. 06/17/19   Bast, Tressia Miners A, NP  diclofenac Sodium (VOLTAREN) 1 % GEL Voltaren 1 % topical gel  APPLY 4 GRAM TO THE AFFECTED AREA(S) BY TOPICAL ROUTE  4 TIMES PER DAY    [provider]  escitalopram (LEXAPRO) 10 MG tablet Take 10 mg by mouth daily as needed.    [provider]  gabapentin (NEURONTIN) 300 MG capsule Take 300 mg by mouth 3 (three) times daily.    [provider]  ibuprofen (ADVIL) 600 MG tablet Take 1 tablet (600 mg total) by mouth every 6 (six) hours as needed. 06/17/19   Bast, Tressia Miners A, NP  losartan-hydrochlorothiazide (HYZAAR) 50-12.5 MG per tablet Take 1 tablet by mouth daily. 02/01/13   Marletta Lor, MD  mupirocin cream (BACTROBAN) 2 % Apply 1 application topically 2 (two) times daily. Patient not  taking: Reported on 09/21/2019 05/08/16   Linde Gillis C, PA  TRAZODONE HCL PO Take by mouth.    [provider]    Allergies    Lisinopril  Review of Systems   Review of Systems  Constitutional: Negative for chills and fever.  Respiratory: Negative for shortness of breath.   Cardiovascular: Negative for chest pain.  Gastrointestinal: Positive for abdominal pain (suprapubic pressure). Negative for nausea and vomiting.  Genitourinary: Positive for dysuria, frequency and urgency. Negative for vaginal bleeding and vaginal discharge.  Musculoskeletal: Positive for back pain.  Neurological: Negative for weakness and numbness.       Negative for incontinence or saddle anesthesia.  All other systems reviewed and are negative.   Physical Exam Updated Vital Signs BP (!) 148/99 (BP Location: Right Arm)   Pulse 76   Temp 98.4 F (36.9 C) (Oral)   Resp 17   LMP 09/17/1999   SpO2 97%   Physical Exam Vitals and nursing note reviewed.  Constitutional:      General: She is not in acute distress.    Appearance: She is well-developed. She is not toxic-appearing.  HENT:     Head: Normocephalic and atraumatic.  Eyes:     General:        Right eye: No discharge.        Left eye: No discharge.     Conjunctiva/sclera: Conjunctivae normal.  Cardiovascular:     Rate and Rhythm: Normal rate and regular rhythm.  Pulmonary:     Effort: Pulmonary effort is normal. No respiratory distress.     Breath sounds: Normal breath sounds. No wheezing, rhonchi or rales.  Abdominal:     General: There is no distension.     Palpations: Abdomen is soft.     Tenderness: There is abdominal tenderness (Mild suprapubic). There is right CVA tenderness. There is no guarding or rebound.  Musculoskeletal:     Cervical back: Neck supple.     Comments: Back: Bilateral lumbar paraspinal muscle tenderness to palpation with right greater than left.  No point/focal vertebral tenderness.  Skin:    General:  Skin is warm and dry.     Findings: No rash.  Neurological:     General: No focal deficit present.     Mental Status: She is alert.     Comments: Clear speech.   Psychiatric:        Behavior: Behavior normal.    ED Results / Procedures / Treatments   Labs (all labs ordered are listed, but only abnormal results are displayed) Labs Reviewed  URINALYSIS, ROUTINE W REFLEX MICROSCOPIC - Abnormal; Notable for the following components:      Result Value   APPearance CLOUDY (*)    Hgb urine dipstick SMALL (*)    Protein, ur 30 (*)    Leukocytes,Ua  LARGE (*)    WBC, UA >50 (*)    Bacteria, UA RARE (*)    All other components within normal limits  CBC WITH DIFFERENTIAL/PLATELET - Abnormal; Notable for the following components:   RBC 5.55 (*)    Hemoglobin 11.9 (*)    MCV 72.1 (*)    MCH 21.4 (*)    MCHC 29.8 (*)    All other components within normal limits  URINE CULTURE  BASIC METABOLIC PANEL    EKG None  Radiology No results found.  Procedures Procedures (including critical care time)  Medications Ordered in ED Medications - No data to display  ED Course/MDM  I have reviewed the triage vital signs and the nursing notes.  Pertinent labs & imaging results that were available during my care of the patient were reviewed by me and considered in my medical decision making (see chart for details).   Patient presents to the emergency department with complaints of urinary symptoms for the past 1 week and back pain over the past few days.  She is nontoxic-appearing, resting comfortably, vitals WNL with exception of elevated blood pressure, doubt HTN emergency.  On exam she has some mild suprapubic tenderness without peritoneal signs, she also has some lumbar paraspinal muscle tenderness as well as some right CVA tenderness.  Urinalysis is consistent with infection.  With her back discomfort there is concern for pyelonephritis.  Will check basic labs and give a dose of IV Rocephin.   Given her back discomfort is fairly steady/achy and noncolicky do not suspect kidney stone. No neuro deficits. Abdomen remains without peritoneal signs suspect acute surgical abdomen.  CBC w/ anemia similar to prior, no leukocytosis, BMP unremarkable. Given 1 dose of IV Rocephin in the ED. Patient tolerating p.o., does not meet SIRS criteria, does not appear septic or toxic, appears appropriate for discharge with outpatient antibiotics. Will discharge home on keflex  and give naproxen to help with pain. I discussed results, treatment plan, need for follow-up, and return precautions with the patient. Provided opportunity for questions, patient confirmed understanding and is in agreement with plan.   Katherine Jarvis was evaluated in Emergency Department on 09/25/2019 for the symptoms described in the history of present illness. He/she was evaluated in the context of the global COVID-19 pandemic, which necessitated consideration that the patient might be at risk for infection with the SARS-CoV-2 virus that causes COVID-19. Institutional protocols and algorithms that pertain to the evaluation of patients at risk for COVID-19 are in a state of rapid change based on information released by regulatory bodies including the CDC and federal and state organizations. These policies and algorithms were followed during the patient's care in the ED.    Final Clinical Impression(s) / ED Diagnoses Final diagnoses:  Pyelonephritis  Hypertension, unspecified type    Rx / DC Orders ED Discharge Orders         Ordered    cephALEXin (KEFLEX) 500 MG capsule  4 times daily     09/25/19 1235    naproxen (NAPROSYN) 500 MG tablet  2 times daily     09/25/19 [address] [place], PA-C 09/25/19 1236    Sherwood Gambler, MD 09/25/19 1504

## 2019-09-25 NOTE — Discharge Instructions (Addendum)
You were seen in the emergency department today for a urinary tract infection that we are concerned may have spread to your kidney otherwise known as pyelonephritis.  Your labs were overall reassuring.  Your hemoglobin was slightly low at 11.9 but similar to prior hemoglobins you have had drawn this is consistent with mild anemia.  Your urine appears infected.  We gave you IV antibiotics in the ER and we also send you home with Keflex, an antibiotic, to treat this.  We also send you home with naproxen to help with your back pain.  - Naproxen is a nonsteroidal anti-inflammatory medication that will help with pain and swelling. Be sure to take this medication as prescribed with food, 1 pill every 12 hours,  It should be taken with food, as it can cause stomach upset, and more seriously, stomach bleeding. Do not take other nonsteroidal anti-inflammatory medications with this such as Advil, Motrin, Aleve, Mobic, Goodie Powder, or Motrin.    You make take Tylenol per over the counter dosing with these medications.   We have prescribed you new medication(s) today. Discuss the medications prescribed today with your pharmacist as they can have adverse effects and interactions with your other medicines including over the counter and prescribed medications. Seek medical evaluation if you start to experience new or abnormal symptoms after taking one of these medicines, seek care immediately if you start to experience difficulty breathing, feeling of your throat closing, facial swelling, or rash as these could be indications of a more serious allergic reaction   Please follow-up with your primary care provider within 3 days for reevaluation of your symptoms as well as a recheck of your blood pressure as it was elevated in the emergency department today.  Return to the ER for new or worsening symptoms including but not limited to worsening pain, inability to keep fluids down, fever, or any other concerns.

## 2019-09-26 LAB — URINE CULTURE: Culture: NO GROWTH

## 2021-01-29 ENCOUNTER — Other Ambulatory Visit: Payer: Self-pay

## 2021-01-29 ENCOUNTER — Encounter (HOSPITAL_COMMUNITY): Payer: Self-pay

## 2021-01-29 ENCOUNTER — Ambulatory Visit (INDEPENDENT_AMBULATORY_CARE_PROVIDER_SITE_OTHER): Payer: Medicare (Managed Care)

## 2021-01-29 ENCOUNTER — Ambulatory Visit (HOSPITAL_COMMUNITY)
Admission: EM | Admit: 2021-01-29 | Discharge: 2021-01-29 | Disposition: A | Discharge status: Home / Self Care | Payer: Medicare (Managed Care) | Attending: Emergency Medicine | Admitting: Emergency Medicine

## 2021-01-29 DIAGNOSIS — X58XXXA Exposure to other specified factors, initial encounter: Secondary | ICD-10-CM | POA: Diagnosis not present

## 2021-01-29 DIAGNOSIS — M79671 Pain in right foot: Secondary | ICD-10-CM | POA: Diagnosis not present

## 2021-01-29 DIAGNOSIS — S99921A Unspecified injury of right foot, initial encounter: Secondary | ICD-10-CM | POA: Diagnosis not present

## 2021-01-29 DIAGNOSIS — S92351A Displaced fracture of fifth metatarsal bone, right foot, initial encounter for closed fracture: Secondary | ICD-10-CM

## 2021-01-29 NOTE — ED Triage Notes (Signed)
Pt in with c/o right foot pain that occurred on Saturday when she fell  Pt has been using ace bandage for support

## 2021-01-29 NOTE — ED Provider Notes (Signed)
[place]    CSN: [id number] Arrival date & time: 01/29/21  1543      History   Chief Complaint Chief Complaint  Patient presents with  . Foot Pain    HPI Katherine Jarvis is a 60 y.o. female.   Katherine Jarvis presents with complaints of right foot pain s/p fall two days ago. She tripped while in her shed, she may have kicked something? Bruising to right upper arm and abrasion to right shin, but pain primarily to right foot toes, with bruising and swelling. No previous foot injury. She has taken tylenol which has minimally helped. Some numbness to right foot. Pain with weight bearing. History of right shoulder surgery but feels shoulder is uninjured.    ROS per HPI, negative if not otherwise mentioned.      Past Medical History:  Diagnosis Date  . Anxiety   . Arthritis   . Blood transfusion without reported diagnosis   . COPD (chronic obstructive pulmonary disease) ([place])   . Hypertension   . Sickle cell trait [facility])     Patient Active Problem List   Diagnosis Date Noted  . Acquired hallux rigidus 09/01/2019  . Lumbar radiculopathy 09/01/2019  . Spondylolisthesis 09/01/2019  . Other atopic dermatitis 02/12/2019  . COPD (chronic obstructive pulmonary disease) ([place]) 07/22/2018  . Prediabetes 02/24/2018  . Vitamin D deficiency 10/22/2016  . Primary insomnia 10/20/2015  . Moderate episode of recurrent major depressive disorder ([place]) 07/05/2015  . Subclinical hyperthyroidism 07/05/2015  . Bilateral carpal tunnel syndrome 05/31/2015  . Primary osteoarthritis involving multiple joints 05/31/2015  . Chronic low back pain without sciatica 05/02/2015  . Cigarette nicotine dependence without complication 54/65/6812  . Primary osteoarthritis of hip 05/02/2015  . Back pain 02/01/2013  . Hip pain 02/01/2013  . FACIAL PAIN 11/28/2010  . TOBACCO USER 11/16/2009  . BRONCHITIS 07/17/2009  . ANEMIA, IRON DEFICIENCY 06/01/2009  . HYPERTENSION 06/01/2009    Past  Surgical History:  Procedure Laterality Date  . ABDOMINAL HYSTERECTOMY    . COLONOSCOPY  2007 and 2014  . FOOT SURGERY    . JOINT REPLACEMENT    . ROTATOR CUFF REPAIR    . TOTAL HIP ARTHROPLASTY      OB History   No obstetric history on file.      Home Medications    Prior to Admission medications   Medication Sig Start Date End Date Taking? Authorizing Provider  ANORO ELLIPTA 62.5-25 MCG/INH AEPB Inhale 1 puff into the lungs 2 (two) times daily. 09/22/19   [provider]  Biotin 5000 MCG CAPS Take 1 capsule by mouth daily.    [provider]  cephALEXin (KEFLEX) 500 MG capsule Take 1 capsule (500 mg total) by mouth 4 (four) times daily. 09/25/19   Petrucelli, Samantha R, PA-C  cyclobenzaprine (FLEXERIL) 10 MG tablet Take 0.5 tablets (5 mg total) by mouth at bedtime. Patient taking differently: Take 5 mg by mouth at bedtime as needed for muscle spasms.  06/17/19   Bast, Tressia Miners A, NP  diclofenac Sodium (VOLTAREN) 1 % GEL Voltaren 1 % topical gel  APPLY 4 GRAM TO THE AFFECTED AREA(S) BY TOPICAL ROUTE 4 TIMES PER DAY    [provider]  escitalopram (LEXAPRO) 10 MG tablet Take 10 mg by mouth daily as needed.    [provider]  gabapentin (NEURONTIN) 300 MG capsule Take 300 mg by mouth 3 (three) times daily.    [provider]  HYDROcodone-acetaminophen (NORCO) 7.5-325 MG  tablet Take 1 tablet by mouth every 6 (six) hours as needed for moderate pain.    [provider]  ibuprofen (ADVIL) 600 MG tablet Take 1 tablet (600 mg total) by mouth every 6 (six) hours as needed. Patient taking differently: Take 600 mg by mouth every 6 (six) hours as needed for fever, headache or moderate pain.  06/17/19   Bast, Tressia Miners A, NP  losartan-hydrochlorothiazide (HYZAAR) 50-12.5 MG per tablet Take 1 tablet by mouth daily. 02/01/13   Marletta Lor, MD  mupirocin cream (BACTROBAN) 2 % Apply 1 application topically 2 (two) times daily. Patient not taking:  Reported on 09/21/2019 05/08/16   Konrad Felix, PA  naproxen (NAPROSYN) 500 MG tablet Take 1 tablet (500 mg total) by mouth 2 (two) times daily. 09/25/19   Petrucelli, Samantha R, PA-C  traMADol (ULTRAM) 50 MG tablet Take 50 mg by mouth every 6 (six) hours as needed for moderate pain.    [provider]  TRAZODONE HCL PO Take by mouth.    [provider]    Family History Family History  Problem Relation Age of Onset  . Cancer Mother   . Colon cancer Mother 58  . Colon polyps Neg Hx   . Esophageal cancer Neg Hx   . Rectal cancer Neg Hx   . Stomach cancer Neg Hx     Social History Social History   Tobacco Use  . Smoking status: Current Every Day Smoker    Packs/day: 0.50    Types: Cigarettes  . Smokeless tobacco: Never Used  Vaping Use  . Vaping Use: Never used  Substance Use Topics  . Alcohol use: Yes    Comment: occasionally-wine  . Drug use: Not Currently    Comment: not for many years     Allergies   Lisinopril   Review of Systems Review of Systems   Physical Exam Triage Vital Signs ED Triage Vitals  Enc Vitals Group     BP 01/29/21 1601 136/80     Pulse Rate 01/29/21 1601 76     Resp 01/29/21 1601 17     Temp 01/29/21 1601 98.5 F (36.9 C)     Temp src --      SpO2 01/29/21 1601 98 %     Weight --      Height --      Head Circumference --      Peak Flow --      Pain Score 01/29/21 1600 10     Pain Loc --      Pain Edu? --      Excl. in [place]? --    No data found.  Updated Vital Signs BP 136/80   Pulse 76   Temp 98.5 F (36.9 C)   Resp 17   LMP 09/17/1999   SpO2 98%   Visual Acuity Right Eye Distance:   Left Eye Distance:   Bilateral Distance:    Right Eye Near:   Left Eye Near:    Bilateral Near:     Physical Exam Constitutional:      General: She is not in acute distress.    Appearance: She is well-developed.  Cardiovascular:     Rate and Rhythm: Normal rate.  Pulmonary:     Effort: Pulmonary effort is  normal.  Musculoskeletal:     Right foot: Decreased range of motion. Tenderness and bony tenderness present.       Feet:     Comments: Tenderness about the MTP  joints of 3-5th toes as well as bruising; tenderness swelling to 5th metatarsal; cap refill < 2 seconds  ; bruising noted to right bicep without bony tenderness; abrasion to right anterior lower without bony tenderness and declines imaging of upper arm and lower leg   Skin:    General: Skin is warm and dry.  Neurological:     Mental Status: She is alert and oriented to person, place, and time.      UC Treatments / Results  Labs (all labs ordered are listed, but only abnormal results are displayed) Labs Reviewed - No data to display  EKG   Radiology DG Foot Complete Right  Result Date: 01/29/2021 CLINICAL DATA:  Acute right foot pain after recent injury. EXAM: RIGHT FOOT COMPLETE - 3+ VIEW COMPARISON:  None. FINDINGS: Moderately displaced fifth metatarsal fracture is noted. No other bony abnormality is noted. Joint spaces are intact. IMPRESSION: Moderately displaced fifth metatarsal fracture. Electronically Signed   By: Marijo Conception M.D.   On: 01/29/2021 16:34    Procedures Procedures (including critical care time)  Medications Ordered in UC Medications - No data to display  Initial Impression / Assessment and Plan / UC Course  I have reviewed the triage vital signs and the nursing notes.  Pertinent labs & imaging results that were available during my care of the patient were reviewed by me and considered in my medical decision making (see chart for details).     5th metatarsal fracture on xray. Post op shoe applied. Patient has hydrocodone already, nsaids also recommended. Pain management and expected course of rehab discussed. Follow up with orthopedics recommended. Patient verbalized understanding and agreeable to plan.   Final Clinical Impressions(s) / UC Diagnoses   Final diagnoses:  Closed displaced  fracture of fifth metatarsal bone of right foot, initial encounter     Discharge Instructions     You have a 5th metatarsal fracture.  Wear the shoe we have provided, with any activity at all, activity as tolerated.  Ibuprofen as needed for pain, ice, elevation.  Please follow up with your orthopedist for recheck and long term management of this    ED Prescriptions    None     PDMP not reviewed this encounter.   Zigmund Gottron, NP 01/29/21 1708

## 2021-01-29 NOTE — Discharge Instructions (Signed)
You have a 5th metatarsal fracture.  Wear the shoe we have provided, with any activity at all, activity as tolerated.  Ibuprofen as needed for pain, ice, elevation.  Please follow up with your orthopedist for recheck and long term management of this

## 2022-06-10 ENCOUNTER — Ambulatory Visit (HOSPITAL_COMMUNITY)
Admission: EM | Admit: 2022-06-10 | Discharge: 2022-06-10 | Disposition: A | Discharge status: Home / Self Care | Payer: Medicare Other | Attending: Family Medicine | Admitting: Family Medicine

## 2022-06-10 ENCOUNTER — Ambulatory Visit (INDEPENDENT_AMBULATORY_CARE_PROVIDER_SITE_OTHER): Payer: Medicare Other

## 2022-06-10 ENCOUNTER — Encounter (HOSPITAL_COMMUNITY): Payer: Self-pay | Admitting: *Deleted

## 2022-06-10 ENCOUNTER — Other Ambulatory Visit: Payer: Self-pay

## 2022-06-10 DIAGNOSIS — R062 Wheezing: Secondary | ICD-10-CM | POA: Diagnosis not present

## 2022-06-10 DIAGNOSIS — M25512 Pain in left shoulder: Secondary | ICD-10-CM | POA: Diagnosis present

## 2022-06-10 DIAGNOSIS — R0602 Shortness of breath: Secondary | ICD-10-CM

## 2022-06-10 DIAGNOSIS — R0781 Pleurodynia: Secondary | ICD-10-CM | POA: Diagnosis not present

## 2022-06-10 DIAGNOSIS — Z20822 Contact with and (suspected) exposure to covid-19: Secondary | ICD-10-CM | POA: Insufficient documentation

## 2022-06-10 DIAGNOSIS — R059 Cough, unspecified: Secondary | ICD-10-CM | POA: Diagnosis not present

## 2022-06-10 LAB — SARS CORONAVIRUS 2 (TAT 6-24 HRS): SARS Coronavirus 2: NEGATIVE

## 2022-06-10 MED ORDER — KETOROLAC TROMETHAMINE 60 MG/2ML IM SOLN
INTRAMUSCULAR | Status: AC
  Filled 2022-06-10: qty 2

## 2022-06-10 MED ORDER — ALBUTEROL SULFATE HFA 108 (90 BASE) MCG/ACT IN AERS
INHALATION_SPRAY | RESPIRATORY_TRACT | Status: AC
  Filled 2022-06-10: qty 6.7

## 2022-06-10 MED ORDER — PREDNISONE 10 MG PO TABS: 10.0000 mg | ORAL_TABLET | Freq: Three times a day (TID) | ORAL | 0 refills | Status: DC

## 2022-06-10 MED ORDER — ALBUTEROL SULFATE HFA 108 (90 BASE) MCG/ACT IN AERS
2.0000 | INHALATION_SPRAY | Freq: Once | RESPIRATORY_TRACT | Status: AC
  Administered 2022-06-10: 2 via RESPIRATORY_TRACT

## 2022-06-10 MED ORDER — KETOROLAC TROMETHAMINE 30 MG/ML IJ SOLN
30.0000 mg | Freq: Once | INTRAMUSCULAR | Status: AC
  Administered 2022-06-10: 30 mg via INTRAMUSCULAR

## 2022-06-10 MED ORDER — KETOROLAC TROMETHAMINE 30 MG/ML IJ SOLN
INTRAMUSCULAR | Status: AC
  Filled 2022-06-10: qty 1

## 2022-06-10 MED ORDER — AZITHROMYCIN 250 MG PO TABS: 250.0000 mg | ORAL_TABLET | Freq: Every day | ORAL | 0 refills | Status: DC

## 2022-06-10 MED ORDER — HYDROCODONE-ACETAMINOPHEN 5-325 MG PO TABS: 1.0000 | ORAL_TABLET | Freq: Four times a day (QID) | ORAL | 0 refills | Status: AC | PRN

## 2022-06-10 NOTE — ED Triage Notes (Addendum)
Pt reports Lt shoulder pain that started Sunday after church. Pt unable to raise Lt arm above her head due to pain. Pt reports having 3 shoulder surgeries to LT shoulder.

## 2022-06-10 NOTE — ED Provider Notes (Addendum)
[place]    CSN: [id number] Arrival date & time: 06/10/22  9381      History   Chief Complaint Chief Complaint  Patient presents with   Shoulder Pain    HPI Katherine Jarvis is a 61 y.o. female.   61 year old female presents with left shoulder pain, shortness of breath and cough.  Patient indicates that she has a history of having rotator cuff injuries and has had 3 surgeries for repairs in the past.  She indicates that on Sunday afternoon that she started having left shoulder and neck pain which has become worse and is accompanied with pain and discomfort.  Patient rates the pain as a 9 on a scale from 1-10.  She also indicates that she is having pain with breathing on the left side, mild shortness of breath associated, and some intermittent wheezing.  Patient indicates that she has taken Norco tablet along with the Zanaflex but this has given her minimal relief.  She does indicates that she has been having some intermittent chills, and has had over the past week chest congestion with intermittent cough and purulent production.  Patient denies fever.  She also indicates that she has not done anything to aggravate her shoulder, no heavy lifting, and she has not traumatized or strained her shoulder.  She does indicate that she smokes and recently has increased to a little over a pack a day over the past couple weeks.  Patient denies nausea or vomiting.   Shoulder Pain   Past Medical History:  Diagnosis Date   Anxiety    Arthritis    Blood transfusion without reported diagnosis    COPD (chronic obstructive pulmonary disease) ([place])    Hypertension    Sickle cell trait ([place])     Patient Active Problem List   Diagnosis Date Noted   Acquired hallux rigidus 09/01/2019   Lumbar radiculopathy 09/01/2019   Spondylolisthesis 09/01/2019   Other atopic dermatitis 02/12/2019   COPD (chronic obstructive pulmonary disease) ([place]) 07/22/2018   Prediabetes 02/24/2018   Vitamin D  deficiency 10/22/2016   Primary insomnia 10/20/2015   Moderate episode of recurrent major depressive disorder ([place]) 07/05/2015   Subclinical hyperthyroidism 07/05/2015   Bilateral carpal tunnel syndrome 05/31/2015   Primary osteoarthritis involving multiple joints 05/31/2015   Chronic low back pain without sciatica 05/02/2015   Cigarette nicotine dependence without complication 82/99/3716   Primary osteoarthritis of hip 05/02/2015   Back pain 02/01/2013   Hip pain 02/01/2013   FACIAL PAIN 11/28/2010   TOBACCO USER 11/16/2009   BRONCHITIS 07/17/2009   ANEMIA, IRON DEFICIENCY 06/01/2009   HYPERTENSION 06/01/2009    Past Surgical History:  Procedure Laterality Date   ABDOMINAL HYSTERECTOMY     COLONOSCOPY  2007 and 2014   FOOT SURGERY     JOINT REPLACEMENT     ROTATOR CUFF REPAIR     TOTAL HIP ARTHROPLASTY      OB History   No obstetric history on file.      Home Medications    Prior to Admission medications   Medication Sig Start Date End Date Taking? Authorizing Provider  azithromycin (ZITHROMAX) 250 MG tablet Take 1 tablet (250 mg total) by mouth daily. Take first 2 tablets together, then 1 every day until finished. 06/10/22  Yes Nyoka Lint, PA-C  HYDROcodone-acetaminophen (NORCO/VICODIN) 5-325 MG tablet Take 1-2 tablets by mouth every 6 (six) hours as needed. 06/10/22  Yes Nyoka Lint, PA-C  predniSONE (DELTASONE) 10 MG tablet Take 1 tablet (  10 mg total) by mouth 3 (three) times daily. 06/10/22  Yes Nyoka Lint, PA-C  ANORO ELLIPTA 62.5-25 MCG/INH AEPB Inhale 1 puff into the lungs 2 (two) times daily. 09/22/19   [provider]  Biotin 5000 MCG CAPS Take 1 capsule by mouth daily.    [provider]  cephALEXin (KEFLEX) 500 MG capsule Take 1 capsule (500 mg total) by mouth 4 (four) times daily. 09/25/19   Petrucelli, Samantha R, PA-C  cyclobenzaprine (FLEXERIL) 10 MG tablet Take 0.5 tablets (5 mg total) by mouth at bedtime. Patient taking differently:  Take 5 mg by mouth at bedtime as needed for muscle spasms.  06/17/19   Bast, Tressia Miners A, NP  diclofenac Sodium (VOLTAREN) 1 % GEL Voltaren 1 % topical gel  APPLY 4 GRAM TO THE AFFECTED AREA(S) BY TOPICAL ROUTE 4 TIMES PER DAY    [provider]  escitalopram (LEXAPRO) 10 MG tablet Take 10 mg by mouth daily as needed.    [provider]  gabapentin (NEURONTIN) 300 MG capsule Take 300 mg by mouth 3 (three) times daily.    [provider]  HYDROcodone-acetaminophen (NORCO) 7.5-325 MG tablet Take 1 tablet by mouth every 6 (six) hours as needed for moderate pain.    [provider]  ibuprofen (ADVIL) 600 MG tablet Take 1 tablet (600 mg total) by mouth every 6 (six) hours as needed. Patient taking differently: Take 600 mg by mouth every 6 (six) hours as needed for fever, headache or moderate pain.  06/17/19   Bast, Tressia Miners A, NP  losartan-hydrochlorothiazide (HYZAAR) 50-12.5 MG per tablet Take 1 tablet by mouth daily. 02/01/13   Marletta Lor, MD  mupirocin cream (BACTROBAN) 2 % Apply 1 application topically 2 (two) times daily. Patient not taking: Reported on 09/21/2019 05/08/16   Konrad Felix, PA  naproxen (NAPROSYN) 500 MG tablet Take 1 tablet (500 mg total) by mouth 2 (two) times daily. 09/25/19   Petrucelli, Samantha R, PA-C  traMADol (ULTRAM) 50 MG tablet Take 50 mg by mouth every 6 (six) hours as needed for moderate pain.    [provider]  TRAZODONE HCL PO Take by mouth.    [provider]    Family History Family History  Problem Relation Age of Onset   Cancer Mother    Colon cancer Mother 56   Colon polyps Neg Hx    Esophageal cancer Neg Hx    Rectal cancer Neg Hx    Stomach cancer Neg Hx     Social History Social History   Tobacco Use   Smoking status: Every Day    Packs/day: 0.50    Types: Cigarettes   Smokeless tobacco: Never  Vaping Use   Vaping Use: Never used  Substance Use Topics   Alcohol use: Yes    Comment:  occasionally-wine   Drug use: Not Currently    Comment: not for many years     Allergies   Lisinopril   Review of Systems Review of Systems  Respiratory:  Positive for cough and shortness of breath.   Musculoskeletal:  Positive for joint swelling (left shoulder pain).     Physical Exam Triage Vital Signs ED Triage Vitals  Enc Vitals Group     BP 06/10/22 1010 (!) 155/84     Pulse Rate 06/10/22 1010 (!) 52     Resp 06/10/22 1010 20     Temp 06/10/22 1010 97.8 F (36.6 C)     Temp src --  SpO2 06/10/22 1010 97 %     Weight --      Height --      Head Circumference --      Peak Flow --      Pain Score 06/10/22 1008 10     Pain Loc --      Pain Edu? --      Excl. in [place]? --    No data found.  Updated Vital Signs BP (!) 155/84   Pulse (!) 52   Temp 97.8 F (36.6 C)   Resp 20   LMP 09/17/1999   SpO2 97%   Visual Acuity Right Eye Distance:   Left Eye Distance:   Bilateral Distance:    Right Eye Near:   Left Eye Near:    Bilateral Near:     Physical Exam Constitutional:      Appearance: Normal appearance.  Cardiovascular:     Rate and Rhythm: Normal rate and regular rhythm.     Heart sounds: Normal heart sounds.  Pulmonary:     Effort: Pulmonary effort is normal.     Breath sounds: Normal breath sounds and air entry. No wheezing, rhonchi or rales.  Lymphadenopathy:     Cervical: No cervical adenopathy.  Neurological:     Mental Status: She is alert.      UC Treatments / Results  Labs (all labs ordered are listed, but only abnormal results are displayed) Labs Reviewed  SARS CORONAVIRUS 2 (TAT 6-24 HRS)    EKG: There is a sinus bradycardia present at 51 bpm, T wave inversion is noted in V1 and V2.  Possible incomplete RBBB may be the present   Radiology DG Chest 2 View  Result Date: 06/10/2022 CLINICAL DATA:  Cough. EXAM: CHEST - 2 VIEW COMPARISON:  December 29, 2006. FINDINGS: Stable cardiomediastinal silhouette. Both lungs are clear. The  visualized skeletal structures are unremarkable. IMPRESSION: No active cardiopulmonary disease. Electronically Signed   By: Marijo Conception M.D.   On: 06/10/2022 10:53    Procedures Procedures (including critical care time)  Medications Ordered in UC Medications  ketorolac (TORADOL) 30 MG/ML injection 30 mg (30 mg Intramuscular Given 06/10/22 1130)  albuterol (VENTOLIN HFA) 108 (90 Base) MCG/ACT inhaler 2 puff (2 puffs Inhalation Given 06/10/22 1130)    Initial Impression / Assessment and Plan / UC Course  I have reviewed the triage vital signs and the nursing notes.  Pertinent labs & imaging results that were available during my care of the patient were reviewed by me and considered in my medical decision making (see chart for details).    Plan: 1.  Advised to take the Zithromax until completed to treat infection and inflammatory process. 2.  Advised take prednisone 10 mg 1 3 times a day for 5 days only to treat the inflammatory process. 3.  Advised to take Vicodin tablets 1-2 every 6-8 hours as needed to help relieve the pain. 4.  Advised to use the albuterol inhaler 2 puffs every 6-8 hours to help decrease wheezing and shortness of breath. 5.  Advised to follow-up with PCP or return to urgent care if symptoms fail to improve. Final Clinical Impressions(s) / UC Diagnoses   Final diagnoses:  Acute pain of left shoulder  Shortness of breath  Pleurodynia  Wheezing     Discharge Instructions      Advised take Vicodin tablets 1-2 every 6-8 hours as needed to help relieve the pain. Advised to take the Zanaflex every 6-8 hours to  help reduce muscle spasm of the shoulder and neck. Advised to take the Zithromax as directed until completed. Advised take prednisone 10 mg 1 3 times a day for the next 5 days to help reduce the inflammatory process. Advised to use the albuterol inhaler 2 puffs every 6 hours to help reduce wheezing and shortness of breath. Advised to reduce the number of  cigarettes smoking per day to help reduce cough. Advised to follow-up PCP or return to urgent care if symptoms fail to improve.     ED Prescriptions     Medication Sig Dispense Auth. Provider   azithromycin (ZITHROMAX) 250 MG tablet Take 1 tablet (250 mg total) by mouth daily. Take first 2 tablets together, then 1 every day until finished. 6 tablet Nyoka Lint, PA-C   predniSONE (DELTASONE) 10 MG tablet Take 1 tablet (10 mg total) by mouth 3 (three) times daily. 15 tablet Nyoka Lint, PA-C   HYDROcodone-acetaminophen (NORCO/VICODIN) 5-325 MG tablet Take 1-2 tablets by mouth every 6 (six) hours as needed. 24 tablet Nyoka Lint, PA-C      I have reviewed the PDMP during this encounter.   Nyoka Lint, PA-C 06/10/22 1151    Nyoka Lint, PA-C 06/10/22 1157

## 2022-06-10 NOTE — Discharge Instructions (Addendum)
Advised take Vicodin tablets 1-2 every 6-8 hours as needed to help relieve the pain. Advised to take the Zanaflex every 6-8 hours to help reduce muscle spasm of the shoulder and neck. Advised to take the Zithromax as directed until completed. Advised take prednisone 10 mg 1 3 times a day for the next 5 days to help reduce the inflammatory process. Advised to use the albuterol inhaler 2 puffs every 6 hours to help reduce wheezing and shortness of breath. Advised to reduce the number of cigarettes smoking per day to help reduce cough. Advised to follow-up PCP or return to urgent care if symptoms fail to improve.

## 2022-06-28 ENCOUNTER — Encounter (HOSPITAL_COMMUNITY): Payer: Self-pay

## 2022-06-28 ENCOUNTER — Ambulatory Visit (HOSPITAL_COMMUNITY)
Admission: EM | Admit: 2022-06-28 | Discharge: 2022-06-28 | Disposition: A | Discharge status: Home / Self Care | Payer: Medicare Other | Attending: Family Medicine | Admitting: Family Medicine

## 2022-06-28 DIAGNOSIS — M545 Low back pain, unspecified: Secondary | ICD-10-CM | POA: Diagnosis not present

## 2022-06-28 DIAGNOSIS — G8929 Other chronic pain: Secondary | ICD-10-CM | POA: Diagnosis not present

## 2022-06-28 LAB — POCT URINALYSIS DIPSTICK, ED / UC
Glucose, UA: NEGATIVE mg/dL
Leukocytes,Ua: NEGATIVE
Nitrite: NEGATIVE
Protein, ur: 30 mg/dL — AB
Specific Gravity, Urine: 1.025 (ref 1.005–1.030)
Urobilinogen, UA: 1 mg/dL (ref 0.0–1.0)
pH: 6 (ref 5.0–8.0)

## 2022-06-28 MED ORDER — DEXAMETHASONE SODIUM PHOSPHATE 10 MG/ML IJ SOLN
10.0000 mg | Freq: Once | INTRAMUSCULAR | Status: AC
  Administered 2022-06-28: 10 mg via INTRAMUSCULAR

## 2022-06-28 MED ORDER — DEXAMETHASONE SODIUM PHOSPHATE 10 MG/ML IJ SOLN
INTRAMUSCULAR | Status: AC
  Filled 2022-06-28: qty 1

## 2022-06-28 MED ORDER — KETOROLAC TROMETHAMINE 30 MG/ML IJ SOLN
30.0000 mg | Freq: Once | INTRAMUSCULAR | Status: AC
Start: 2022-06-28 — End: 2022-06-28
  Administered 2022-06-28: 30 mg via INTRAMUSCULAR

## 2022-06-28 MED ORDER — KETOROLAC TROMETHAMINE 30 MG/ML IJ SOLN
INTRAMUSCULAR | Status: AC
  Filled 2022-06-28: qty 1

## 2022-06-28 MED ORDER — CYCLOBENZAPRINE HCL 10 MG PO TABS: 10.0000 mg | ORAL_TABLET | Freq: Two times a day (BID) | ORAL | 0 refills | Status: DC | PRN

## 2022-06-28 NOTE — Discharge Instructions (Addendum)
Your blood pressure is elevated today and take blood pressure medication as prescribed. Recommend drinking water as your urine analysis indicates mild dehydration which can cause dizziness.  Back pain is chronic, sending referral to [facility]. You received Toradol and Decadron injections today. For home management of back pain, I have sent over cyclobenzaprine. Continue Gabapentin at bedtime. Apply heating pad at the first sign of  back pain.

## 2022-06-28 NOTE — ED Provider Notes (Signed)
[place]    CSN: [id number] Arrival date & time: 06/28/22  1901      History   Chief Complaint Chief Complaint  Patient presents with   Back Pain   Urinary Frequency   Dizziness    HPI Katherine Jarvis is a 61 y.o. female.   HPI Patient presents for evaluation of chronic back pain, urinary frequency and intermittent dizziness.  Patient endorses that she does not drink water and she is a smoker. She seen here at urgent care approximately 2 weeks ago and treated with prednisone and Hydrocodone for pain.  During that visit she also received a Toradol IM injection which she reports did calm her level of pain down.  She was on prednisone for a total of 5 days and finished that over a week ago.  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 She endorses that she has not seen a provider in quite some time.  Past Medical History:  Diagnosis Date   Anxiety    Arthritis    Blood transfusion without reported diagnosis    COPD (chronic obstructive pulmonary disease) ([place])    Hypertension    Sickle cell trait ([place])     Patient Active Problem List   Diagnosis Date Noted   Acquired hallux rigidus 09/01/2019   Lumbar radiculopathy 09/01/2019   Spondylolisthesis 09/01/2019   Other atopic dermatitis 02/12/2019   COPD (chronic obstructive pulmonary disease) ([place]) 07/22/2018   Prediabetes 02/24/2018   Vitamin D deficiency 10/22/2016   Primary insomnia 10/20/2015   Moderate episode of recurrent major depressive disorder ([place]) 07/05/2015   Subclinical hyperthyroidism 07/05/2015   Bilateral carpal tunnel syndrome 05/31/2015   Primary osteoarthritis involving multiple joints 05/31/2015   Chronic low back pain without sciatica 05/02/2015   Cigarette nicotine dependence without complication 16/96/7893   Primary osteoarthritis of hip 05/02/2015   Back pain 02/01/2013   Hip pain 02/01/2013   FACIAL PAIN 11/28/2010   TOBACCO USER 11/16/2009   BRONCHITIS  07/17/2009   ANEMIA, IRON DEFICIENCY 06/01/2009   HYPERTENSION 06/01/2009    Past Surgical History:  Procedure Laterality Date   ABDOMINAL HYSTERECTOMY     COLONOSCOPY  2007 and 2014   FOOT SURGERY     JOINT REPLACEMENT     ROTATOR CUFF REPAIR     TOTAL HIP ARTHROPLASTY      OB History   No obstetric history on file.      Home Medications    Prior to Admission medications   Medication Sig Start Date End Date Taking? Authorizing Provider  ANORO ELLIPTA 62.5-25 MCG/INH AEPB Inhale 1 puff into the lungs 2 (two) times daily. 09/22/19  Yes [provider]  Biotin 5000 MCG CAPS Take 1 capsule by mouth daily.   Yes [provider]  cyclobenzaprine (FLEXERIL) 10 MG tablet Take 1 tablet (10 mg total) by mouth 2 (two) times daily as needed for muscle spasms. 06/28/22  Yes Scot Jun, FNP  escitalopram (LEXAPRO) 10 MG tablet Take 10 mg by mouth daily as needed.   Yes [provider]  gabapentin (NEURONTIN) 300 MG capsule Take 300 mg by mouth 3 (three) times daily.   Yes [provider]  HYDROcodone-acetaminophen (NORCO) 7.5-325 MG tablet Take 1 tablet by mouth every 6 (six) hours as needed for moderate pain.   Yes [provider]  losartan-hydrochlorothiazide (HYZAAR) 50-12.5 MG per tablet Take 1 tablet by mouth daily. 02/01/13  Yes Marletta Lor, MD  traMADol Veatrice Bourbon)  50 MG tablet Take 50 mg by mouth every 6 (six) hours as needed for moderate pain.   Yes [provider]  TRAZODONE HCL PO Take by mouth.   Yes [provider]  azithromycin (ZITHROMAX) 250 MG tablet Take 1 tablet (250 mg total) by mouth daily. Take first 2 tablets together, then 1 every day until finished. 06/10/22   Nyoka Lint, PA-C  cephALEXin (KEFLEX) 500 MG capsule Take 1 capsule (500 mg total) by mouth 4 (four) times daily. 09/25/19   Petrucelli, Samantha R, PA-C  diclofenac Sodium (VOLTAREN) 1 % GEL Voltaren 1 % topical gel  APPLY 4 GRAM TO THE  AFFECTED AREA(S) BY TOPICAL ROUTE 4 TIMES PER DAY    [provider]  HYDROcodone-acetaminophen (NORCO/VICODIN) 5-325 MG tablet Take 1-2 tablets by mouth every 6 (six) hours as needed. 06/10/22   Nyoka Lint, PA-C  ibuprofen (ADVIL) 600 MG tablet Take 1 tablet (600 mg total) by mouth every 6 (six) hours as needed. Patient taking differently: Take 600 mg by mouth every 6 (six) hours as needed for fever, headache or moderate pain.  06/17/19   Loura Halt A, NP  mupirocin cream (BACTROBAN) 2 % Apply 1 application topically 2 (two) times daily. Patient not taking: Reported on 09/21/2019 05/08/16   Konrad Felix, PA  naproxen (NAPROSYN) 500 MG tablet Take 1 tablet (500 mg total) by mouth 2 (two) times daily. 09/25/19   Petrucelli, Samantha R, PA-C  predniSONE (DELTASONE) 10 MG tablet Take 1 tablet (10 mg total) by mouth 3 (three) times daily. 06/10/22   Nyoka Lint, PA-C    Family History Family History  Problem Relation Age of Onset   Cancer Mother    Colon cancer Mother 57   Colon polyps Neg Hx    Esophageal cancer Neg Hx    Rectal cancer Neg Hx    Stomach cancer Neg Hx     Social History Social History   Tobacco Use   Smoking status: Every Day    Packs/day: 0.50    Types: Cigarettes   Smokeless tobacco: Never  Vaping Use   Vaping Use: Never used  Substance Use Topics   Alcohol use: Yes    Comment: occasionally-wine   Drug use: Not Currently    Comment: not for many years     Allergies   Lisinopril   Review of Systems Review of Systems Pertinent negatives listed in HPI   Physical Exam Triage Vital Signs ED Triage Vitals  Enc Vitals Group     BP 06/28/22 1951 (!) 186/93     Pulse Rate 06/28/22 1951 63     Resp 06/28/22 1951 16     Temp 06/28/22 1951 98.1 F (36.7 C)     Temp Source 06/28/22 1951 Oral     SpO2 06/28/22 1951 99 %     Weight --      Height --      Head Circumference --      Peak Flow --      Pain Score 06/28/22 1954 10     Pain Loc --       Pain Edu? --      Excl. in [place]? --    No data found.  Updated Vital Signs BP (!) 186/93 (BP Location: Right Arm)   Pulse 63   Temp 98.1 F (36.7 C) (Oral)   Resp 16   LMP 09/17/1999   SpO2 99%   Visual Acuity Right Eye Distance:   Left  Eye Distance:   Bilateral Distance:    Right Eye Near:   Left Eye Near:    Bilateral Near:     Physical Exam Constitutional:      Appearance: Normal appearance.  HENT:     Head: Normocephalic and atraumatic.  Cardiovascular:     Rate and Rhythm: Normal rate and regular rhythm.  Pulmonary:     Effort: Pulmonary effort is normal.     Breath sounds: Normal breath sounds.  Musculoskeletal:       Arms:  Skin:    General: Skin is warm.     Capillary Refill: Capillary refill takes less than 2 seconds.  Neurological:     General: No focal deficit present.     Mental Status: She is alert and oriented to person, place, and time.  Psychiatric:        Mood and Affect: Mood normal.        Behavior: Behavior normal.    UC Treatments / Results  Labs (all labs ordered are listed, but only abnormal results are displayed) Labs Reviewed  POCT URINALYSIS DIPSTICK, ED / UC - Abnormal; Notable for the following components:      Result Value   Bilirubin Urine SMALL (*)    Ketones, ur TRACE (*)    Hgb urine dipstick MODERATE (*)    Protein, ur 30 (*)    All other components within normal limits  URINALYSIS, DIPSTICK ONLY    EKG   Radiology No results found.  Procedures Procedures (including critical care time)  Medications Ordered in UC Medications  ketorolac (TORADOL) 30 MG/ML injection 30 mg (30 mg Intramuscular Given 06/28/22 2039)  dexamethasone (DECADRON) injection 10 mg (10 mg Intramuscular Given 06/28/22 2039)    Initial Impression / Assessment and Plan / UC Course  I have reviewed the triage vital signs and the nursing notes.  Pertinent labs & imaging results that were available during my care of the patient were  reviewed by me and considered in my medical decision making (see chart for details).    Chronic back pain, referral placed to Ortho care.  Toradol and Decadron IM given here in clinic.  Patient advised to continue management of pain with gabapentin prescribed by her PCP at bedtime and I will also prescribe cyclobenzaprine to take as needed.  Also encouraged to use heating pad.  Encouraged hydrating well with water and to avoid prolonged periods of lying in the bed as this can exacerbate back pain.  Return as needed. Final diagnoses:  Chronic bilateral low back pain without sciatica     Discharge Instructions      Your blood pressure is elevated today and take blood pressure medication as prescribed. Recommend drinking water as your urine analysis indicates mild dehydration which can cause dizziness.  Back pain is chronic, sending referral to Ortho Care. You received Toradol and Decadron injections today. For home management of back pain, I have sent over cyclobenzaprine. Continue Gabapentin at bedtime. Apply heating pad at the first sign of  back pain.      ED Prescriptions     Medication Sig Dispense Auth. Provider   cyclobenzaprine (FLEXERIL) 10 MG tablet Take 1 tablet (10 mg total) by mouth 2 (two) times daily as needed for muscle spasms. 30 tablet Scot Jun, FNP      PDMP not reviewed this encounter.   Scot Jun, [place] 06/28/22 2054

## 2022-06-28 NOTE — ED Triage Notes (Signed)
Low back pain onset last night. Patient having urinary frequency. Patient drinks a lot pf sweet tea.  No recent falls or injures. Does have history of chronic back pain due to bone spurs and slipped disc.

## 2022-07-15 ENCOUNTER — Encounter: Payer: Self-pay | Admitting: Physician Assistant

## 2022-07-15 ENCOUNTER — Ambulatory Visit (INDEPENDENT_AMBULATORY_CARE_PROVIDER_SITE_OTHER): Payer: Medicare Other

## 2022-07-15 ENCOUNTER — Ambulatory Visit (INDEPENDENT_AMBULATORY_CARE_PROVIDER_SITE_OTHER): Payer: Medicare Other | Admitting: Physician Assistant

## 2022-07-15 DIAGNOSIS — G8929 Other chronic pain: Secondary | ICD-10-CM

## 2022-07-15 DIAGNOSIS — M542 Cervicalgia: Secondary | ICD-10-CM | POA: Diagnosis not present

## 2022-07-15 DIAGNOSIS — M25512 Pain in left shoulder: Secondary | ICD-10-CM

## 2022-07-15 DIAGNOSIS — M545 Low back pain, unspecified: Secondary | ICD-10-CM | POA: Diagnosis not present

## 2022-07-15 NOTE — Addendum Note (Signed)
Addended by: Robyne Peers on: 07/15/2022 03:48 PM   Modules accepted: Orders

## 2022-07-15 NOTE — Progress Notes (Signed)
Office Visit Note   Patient: Katherine Jarvis           Date of Birth: 05-07-1961           MRN: [id number] Visit Date: 07/15/2022              Requested by: Bing Neighbors, FNP [address] [place],  [place] [address] PCP: Solmon Ice, MD   Assessment & Plan: Visit Diagnoses:  1. Chronic bilateral low back pain without sciatica   2. Chronic left shoulder pain   3. Neck pain     Plan: I reviewed her plain films with her today.  She has some minimal degenerative changes of her cervical spine and loss of the lordotic curvature.  Lumbar spine lower lumbar facet arthritic changes were noted and spinal listhesis at L3-4 and L4-5 noted.  Her shoulder films were normal I was unable to see any evidence of an old fracture involving the left shoulder there was some sclerotic activity of unknown origin involving the greater tuberosity.  Therefore given her radiographic findings and her clinical findings recommend physical therapy for both her back and her neck.  We will send her for therapy here at our office for range of motion, strengthening, stretching, modalities and home exercise program for her neck and back.  She will follow-up with [place] in 6 weeks to see what type of results she had with this treatment.  She will continue her Flexeril, gabapentin and tramadol.  Follow-Up Instructions: Return in about 6 weeks (around 08/26/2022).   Orders:  Orders Placed This Encounter  Procedures   XR Lumbar Spine 2-3 Views   XR Shoulder Left   XR Cervical Spine 2 or 3 views   No orders of the defined types were placed in this encounter.     Procedures: No procedures performed   Clinical Data: No additional findings.   Subjective: Chief Complaint  Patient presents with   Neck - Pain   Left Shoulder - Pain   Lower Back - Pain    HPI Katherine Jarvis is a 61 year old female were seen for the first time for neck pain, left shoulder pain and low back pain.  She states that her neck pains been  ongoing since she had a fracture of her left shoulder.  She is seen at [facility] and was told she only needed the sling physical therapy.  She went to 4 visits with physical therapy and had no real relief with this.  She states she really had no follow-up.  She is unsure of what was fractured in February of this year in the shoulder.  She states that it was a nondisplaced fracture.  She notes decreased range of motion of her left shoulder pain in the shoulder is described as achy and she rates it to be 8 out of 10 pain at worst.  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 Aundria Rud [facility].  States right shoulder overall is doing well.  Denies any numbness tingling down either arm.  Describes her neck pain is achy 7 out of 10 pain at worst.  Notes decreased range of motion of the neck.  She is also having chronic low back pain that is been ongoing for years.  She denies any numbness tingling or radicular symptoms.  Pain is 9 out of 10 pain at worst.  Low back.  Denies any waking pain, fevers, chills, bowel or bladder dysfunction or saddle anesthesia.  She reports that in 2015 in [place] she had physical therapy for her back this only made her pain worse.  Notes that her back pain is worse with standing and walking.  She does have remote history of right total hip arthroplasty 2015 that was performed in [place].  States right hip overall doing well.  Review of Systems  Musculoskeletal:  Positive for arthralgias, back pain, neck pain and neck stiffness.     Objective: Vital Signs: LMP 09/17/1999   Physical Exam Constitutional:      Appearance: She is normal weight. She is not ill-appearing or diaphoretic.  Cardiovascular:     Pulses: Normal pulses.  Pulmonary:     Effort: Pulmonary effort is normal.  Neurological:     Mental Status: She is alert and oriented to  person, place, and time.  Psychiatric:        Mood and Affect: Mood normal.     Ortho Exam Cervical spine: Full flexion extension with some mild discomfort.  Tenderness left trapezius region no tenderness along medial border of scapula bilaterally.  Negative Spurling's bilaterally. Bilateral upper extremities 5 out of 5 strength throughout against resistance.  Rotator cuff strength testing is 5 out of 5 throughout except for lift off test which she has weakness with bilaterally.  She has full range of motion of both shoulders actively and passively.  Negative impingement testing bilaterally.  Radial pulses are 2+ and equal symmetric bilaterally.  Full motor bilateral hands and subjective full sensation bilateral hands throughout. Lower extremity: 5 out of 5 strength throughout the lower extremities against resistance.  Negative straight leg raise bilaterally.  She is full forward flexion and extension of the lumbar spine without pain.  Sensation intact bilateral feet to light touch. Specialty Comments:  No specialty comments available.  Imaging: XR Shoulder Left  Result Date: 07/15/2022 Left shoulder 3 views: Shoulder is well located.  No acute fractures.  Glenohumeral joint is well-maintained.  Sclerotic activity over the greater tuberosity otherwise no acute findings.  XR Cervical Spine 2 or 3 views  Result Date: 07/15/2022 Cervical spine 2 views: Loss of lordotic curvature.  Disc space overall well-maintained.  Minimal endplate spurring noted at multiple levels.  No acute findings or acute fractures.  XR Lumbar Spine 2-3 Views  Result Date: 07/15/2022 Lumbar spine 2 views: Lumbar facet changes.  Grade 1 spondylolisthesis L3-L4 and grade 1-2 L4 on 5.  Normal lordotic curvature.  No acute fractures acute findings disc base overall well-maintained.    PMFS History: Patient Active Problem List   Diagnosis Date Noted   Acquired hallux rigidus 09/01/2019   Lumbar radiculopathy 09/01/2019    Spondylolisthesis 09/01/2019   Other atopic dermatitis 02/12/2019   COPD (chronic obstructive pulmonary disease) ([facility]) 07/22/2018   Prediabetes 02/24/2018   Vitamin D deficiency 10/22/2016   Primary insomnia 10/20/2015   Moderate episode of recurrent major depressive disorder ([facility]) 07/05/2015   Subclinical hyperthyroidism 07/05/2015   Bilateral carpal tunnel syndrome 05/31/2015   Primary osteoarthritis involving multiple joints 05/31/2015   Chronic low back pain without sciatica 05/02/2015   Cigarette nicotine dependence without complication 05/02/2015   Primary osteoarthritis of hip 05/02/2015   Back pain 02/01/2013   Hip pain 02/01/2013   FACIAL PAIN 11/28/2010   TOBACCO USER 11/16/2009   BRONCHITIS 07/17/2009   ANEMIA, IRON DEFICIENCY 06/01/2009   HYPERTENSION 06/01/2009   Past Medical History:  Diagnosis Date   Anxiety    Arthritis    Blood transfusion without reported diagnosis    COPD (chronic obstructive pulmonary disease) ([facility])    Hypertension    Sickle cell trait ([facility])     Family History  Problem Relation Age of Onset   Cancer Mother    Colon cancer Mother 70   Colon polyps Neg Hx    Esophageal cancer Neg Hx    Rectal cancer Neg Hx    Stomach cancer Neg Hx     Past Surgical History:  Procedure Laterality Date   ABDOMINAL HYSTERECTOMY     COLONOSCOPY  2007 and 2014   FOOT SURGERY     JOINT REPLACEMENT     ROTATOR CUFF REPAIR     TOTAL HIP ARTHROPLASTY     Social History   Occupational History   Not on file  Tobacco Use   Smoking status: Every Day    Packs/day: 0.50    Types: Cigarettes   Smokeless tobacco: Never  Vaping Use   Vaping Use: Never used  Substance and Sexual Activity   Alcohol use: Yes    Comment: occasionally-wine   Drug use: Not Currently    Comment: not for many years   Sexual activity: Not on file

## 2022-07-24 NOTE — Therapy (Signed)
OUTPATIENT PHYSICAL THERAPY EVALUATION   Patient Name: Katherine Jarvis MRN: [id number] DOB:04/27/1961, 61 y.o., female Today's Date: 07/25/2022  END OF SESSION:   PT End of Session - 07/25/22 1005     Visit Number 1    Number of Visits 20    Date for PT Re-Evaluation 10/03/22    Authorization Type [employer] Medicare 20% insurance    Progress Note Due on Visit 10    PT Start Time 1015    PT Stop Time 1058    PT Time Calculation (min) 43 min    Activity Tolerance Patient tolerated treatment well    Behavior During Therapy WFL for tasks assessed/performed             Past Medical History:  Diagnosis Date   Anxiety    Arthritis    Blood transfusion without reported diagnosis    COPD (chronic obstructive pulmonary disease) (HCC)    Hypertension    Sickle cell trait (HCC)    Past Surgical History:  Procedure Laterality Date   ABDOMINAL HYSTERECTOMY     COLONOSCOPY  2007 and 2014   FOOT SURGERY     JOINT REPLACEMENT     ROTATOR CUFF REPAIR     TOTAL HIP ARTHROPLASTY     Patient Active Problem List   Diagnosis Date Noted   Acquired hallux rigidus 09/01/2019   Lumbar radiculopathy 09/01/2019   Spondylolisthesis 09/01/2019   Other atopic dermatitis 02/12/2019   COPD (chronic obstructive pulmonary disease) (HCC) 07/22/2018   Prediabetes 02/24/2018   Vitamin D deficiency 10/22/2016   Primary insomnia 10/20/2015   Moderate episode of recurrent major depressive disorder (HCC) 07/05/2015   Subclinical hyperthyroidism 07/05/2015   Bilateral carpal tunnel syndrome 05/31/2015   Primary osteoarthritis involving multiple joints 05/31/2015   Chronic low back pain without sciatica 05/02/2015   Cigarette nicotine dependence without complication 05/02/2015   Primary osteoarthritis of hip 05/02/2015   Back pain 02/01/2013   Hip pain 02/01/2013   FACIAL PAIN 11/28/2010   TOBACCO USER 11/16/2009   BRONCHITIS 07/17/2009   ANEMIA, IRON DEFICIENCY 06/01/2009   HYPERTENSION  06/01/2009    PCP: Solmon Ice MD  REFERRING PROVIDER: Kirtland Bouchard, PA-C  REFERRING DIAG: [phone] (ICD-10-CM) - Chronic left shoulder pain M54.2 (ICD-10-CM) - Neck pain  THERAPY DIAG:  Cervicalgia  Chronic left shoulder pain  Other low back pain  Muscle weakness (generalized)  Difficulty in walking, not elsewhere classified  Rationale for Evaluation and Treatment Rehabilitation  ONSET DATE: Approx. April 2023  SUBJECTIVE:  SUBJECTIVE STATEMENT: Pt indicated a fall earlier this year and reported pain in Lt shoulder as well as neck.  Pt indicated having complaints of back pain chronic (unrelated to fall).  Aching at night noted.  Reported difficulty cleaning up/lifting items.  Pt indicated sweeping, vacuuming are troublesome.   PERTINENT HISTORY:  See medical history above.  History of shots, physical therapy in back in past, Rt THA.  PAIN:  NPRS scale: at worst 10/10 Pain location: neck, Lt shoulder, back Pain description: achy primary.   Aggravating factors: sleeping, lifting, carrying Relieving factors: resting arm helps, medicine at times  PRECAUTIONS: None  WEIGHT BEARING RESTRICTIONS No  FALLS:  Has patient fallen in last 6 months? Yes 1   LIVING ENVIRONMENT: Lives in: House/apartment Stairs: two story with bedroom on second story (sometimes doing one step at a time)  OCCUPATION: Disability due to back  PLOF: Independent, home care/cleaning, Rt handed.  Likes to go the gym, steam room and sauna.  Used to walk but limited at times due to back pain.   Helps take care grandkids.   PATIENT GOALS Reduce pain.   OBJECTIVE:   PATIENT SURVEYS:  07/25/2022 FOTO intake: 51   predicted:  62  COGNITION: 07/25/2022 Overall cognitive status: Within  functional limits for tasks assessed  SENSATION: 07/25/2022 WFL  POSTURE:  07/25/2022  rounded shoulders and forward head  PALPATION: 07/25/2022 Tenderness in Lt cervical paraspinals, Lt upper trap, Lt infraspinatus with trigger points noted   Spinal ROM:   No centralization/peripheralization noted c cervical or lumbar movements.  Active ROM AROM (deg) 07/25/2022  Cervical Flexion 54 c central, Lt neck pain  Cervical  Extension 58 deg c Lt cervical pain  Right lateral flexion   Left lateral flexion   Cervical  Right rotation 84 c no complaints  Cervical  Left rotation 72 c Lt cervical pain     Lumbar flexion To mid shin, pulling in back  Lumbar extension 50% WFL c ERP lumbar, improved to 75 % c 5 reps in standing ERP still noted  Rt lumbar lateral flexion Lt sided pulling  Lt lumbar lateral flexion Lt sided back pain   (Blank rows = not tested)    UPPER EXTREMITY ROM:  ROM Right  Left   Shoulder flexion WFL AROM against gravity: 120 deg c pain c end range pain  AROM in supine:  155 deg c pain end range  Shoulder extension    Shoulder abduction  AROM in supine 125 c end range pain  Shoulder adduction    Shoulder extension    Shoulder internal rotation  AROM 70 deg in supine 45 deg abduction c end range tightness noted  Shoulder external rotation  AROM 80 deg in supine 45 deg abduction  Elbow flexion    Elbow extension    Wrist flexion    Wrist extension    Wrist ulnar deviation    Wrist radial deviation    Wrist pronation    Wrist supination     (Blank rows = not tested)  UPPER EXTREMITY MMT:  MMT Right  Left   Shoulder flexion 4+/5 4/5 c pain  Shoulder extension    Shoulder abduction 5/5 4/5  Shoulder adduction    Shoulder extension    Shoulder internal rotation 5/5 5/5  Shoulder external rotation 5/5 4/5 c pain  Middle trapezius    Lower trapezius    Elbow flexion 5/5 5/5  Elbow extension 5/5 5/5  Wrist flexion    Wrist  extension    Wrist  ulnar deviation    Wrist radial deviation    Wrist pronation    Wrist supination    Grip strength     (Blank rows = not tested)  SPECIAL TESTS:  07/25/2022 No specific testing was required for cervical, lumbar today.  (-) Drop arm, painful arc for Lt shoulder today  FUNCTIONAL TESTS:  07/25/2022 18 inch chair transfer:  no UE on 1st try   TODAY'S TREATMENT:  07/25/2022  Therex:    HEP instruction/performance c cues for techniques, handout provided.  Trial set performed of each for comprehension and symptom assessment.  See below for exercise list   PATIENT EDUCATION:  07/25/2022 Education details: HEP, POC, DN information Person educated: Patient Education method: Explanation, Demonstration, Verbal cues, and Handouts Education comprehension: verbalized understanding, returned demonstration, and verbal cues required  HOME EXERCISE PROGRAM: Access Code: [email or web address] URL: [email or web address] Date: 07/25/2022 Prepared by: Chyrel Masson  Exercises - Supine Lower Trunk Rotation  - 2-3 x daily - 7 x weekly - 1 sets - 3-5 reps - 15 hold - Supine Cervical Retraction with Towel  - 2 x daily - 7 x weekly - 1 sets - 10 reps - 5 hold - Seated Upper Trapezius Stretch (Mirrored)  - 2-3 x daily - 7 x weekly - 1 sets - 5 reps - 15 hold - Standing Scapular Retraction  - 2-3 x daily - 7 x weekly - 1 sets - 10 reps - 5 hold  ASSESSMENT:  CLINICAL IMPRESSION: Patient is a 61 y.o.who comes to clinic with complaints of cervical pain, Lt shoulder pain and low back pain with mobility, strength and movement coordination deficits that impair their ability to perform usual daily and recreational functional activities without increase difficulty/symptoms at this time.  Patient to benefit from skilled PT services to address impairments and limitations to improve to previous level of function without restriction secondary to condition.   OBJECTIVE IMPAIRMENTS decreased activity tolerance,  decreased coordination, decreased endurance, decreased mobility, difficulty walking, decreased ROM, decreased strength, hypomobility, increased fascial restrictions, impaired perceived functional ability, increased muscle spasms, impaired flexibility, impaired UE functional use, improper body mechanics, postural dysfunction, and pain.   ACTIVITY LIMITATIONS carrying, lifting, bending, standing, squatting, sleeping, stairs, transfers, dressing, reach over head, and locomotion level  PARTICIPATION LIMITATIONS: meal prep, cleaning, laundry, interpersonal relationship, shopping, and community activity  PERSONAL FACTORS  COPD, HTN, sickle cell trait, length of time since onset, multiple body parts  are also affecting patient's functional outcome.   REHAB POTENTIAL: Good  CLINICAL DECISION MAKING: Moderate/evolving  EVALUATION COMPLEXITY: Medium    GOALS: Goals reviewed with patient? Yes  Short term PT Goals (target date for Short term goals are 3 weeks 08/15/2022) Patient will demonstrate independent use of home exercise program to maintain progress from in clinic treatments. Goal status: New   Long term PT goals (target dates for all long term goals are 10 weeks  10/03/2022 )   1. Patient will demonstrate/report pain at worst less than or equal to 2/10 to facilitate minimal limitation in daily activity secondary to pain symptoms. Goal status: New   2. Patient will demonstrate independent use of home exercise program to facilitate ability to maintain/progress functional gains from skilled physical therapy services. Goal status: New   3. Patient will demonstrate FOTO outcome > or = 62 % to indicate reduced disability due to condition. Goal status: New   4.  Patient will demonstrate cervical AROM WFL s symptoms to facilitate  usual head movements for daily activity including driving, self care.   Goal status: New   5.  Patient will demonstrate Lt shoulder AROM WFL s symptoms for overhead  reaching, lifting ability at PLOF.    Goal status: New   6.  Patient will demonstrate Lt Shoulder MMT 5/5 throughout to facilitate usual lifting/carrying at PLOF s limitation.   Goal status: New    PLAN: PT FREQUENCY: 1-2x/week  PT DURATION: 10 weeks  PLANNED INTERVENTIONS:  Therapeutic exercises, Therapeutic activity, Neuro Muscular re-education, Balance training, Gait training, Patient/Family education, Joint mobilization, Stair training, DME instructions, Dry Needling, Electrical stimulation, Cryotherapy, Traction Moist heat, Taping, Ultrasound, Ionotophoresis 4mg /ml Dexamethasone, and Manual therapy.  All included unless contraindicated  PLAN FOR NEXT SESSION: Dry needling /myofascial release Lt upper trap, shoulder as desired.  Progressive mobility/strengthening.   Chyrel Masson, PT, DPT, OCS, ATC 07/25/22  11:39 AM

## 2022-07-25 ENCOUNTER — Ambulatory Visit (INDEPENDENT_AMBULATORY_CARE_PROVIDER_SITE_OTHER): Payer: Medicare Other | Admitting: Rehabilitative and Restorative Service Providers"

## 2022-07-25 ENCOUNTER — Encounter: Payer: Self-pay | Admitting: Rehabilitative and Restorative Service Providers"

## 2022-07-25 ENCOUNTER — Other Ambulatory Visit: Payer: Self-pay

## 2022-07-25 DIAGNOSIS — M25512 Pain in left shoulder: Secondary | ICD-10-CM | POA: Diagnosis not present

## 2022-07-25 DIAGNOSIS — M6281 Muscle weakness (generalized): Secondary | ICD-10-CM

## 2022-07-25 DIAGNOSIS — M5459 Other low back pain: Secondary | ICD-10-CM | POA: Diagnosis not present

## 2022-07-25 DIAGNOSIS — G8929 Other chronic pain: Secondary | ICD-10-CM

## 2022-07-25 DIAGNOSIS — M542 Cervicalgia: Secondary | ICD-10-CM

## 2022-07-25 DIAGNOSIS — R262 Difficulty in walking, not elsewhere classified: Secondary | ICD-10-CM

## 2022-07-29 ENCOUNTER — Encounter: Payer: Self-pay | Admitting: Rehabilitative and Restorative Service Providers"

## 2022-07-29 ENCOUNTER — Ambulatory Visit (INDEPENDENT_AMBULATORY_CARE_PROVIDER_SITE_OTHER): Payer: Medicare Other | Admitting: Rehabilitative and Restorative Service Providers"

## 2022-07-29 DIAGNOSIS — R262 Difficulty in walking, not elsewhere classified: Secondary | ICD-10-CM

## 2022-07-29 DIAGNOSIS — M5459 Other low back pain: Secondary | ICD-10-CM | POA: Diagnosis not present

## 2022-07-29 DIAGNOSIS — M542 Cervicalgia: Secondary | ICD-10-CM

## 2022-07-29 DIAGNOSIS — M25512 Pain in left shoulder: Secondary | ICD-10-CM | POA: Diagnosis not present

## 2022-07-29 DIAGNOSIS — M6281 Muscle weakness (generalized): Secondary | ICD-10-CM

## 2022-07-29 DIAGNOSIS — G8929 Other chronic pain: Secondary | ICD-10-CM

## 2022-07-29 NOTE — Therapy (Signed)
OUTPATIENT PHYSICAL THERAPY TREATMENT   Patient Name: Katherine Jarvis MRN: [id number] DOB:1961-03-13, 61 y.o., female Today's Date: 07/29/2022  PCP: Solmon Ice MD  REFERRING PROVIDER: Kirtland Bouchard, PA-C  END OF SESSION:   PT End of Session - 07/29/22 0832     Visit Number 2    Number of Visits 20    Date for PT Re-Evaluation 10/03/22    Authorization Type [employer] Medicare 20% insurance    Progress Note Due on Visit 10    PT Start Time 0806    PT Stop Time 0844    PT Time Calculation (min) 38 min    Activity Tolerance Patient tolerated treatment well    Behavior During Therapy WFL for tasks assessed/performed              Past Medical History:  Diagnosis Date   Anxiety    Arthritis    Blood transfusion without reported diagnosis    COPD (chronic obstructive pulmonary disease) (HCC)    Hypertension    Sickle cell trait (HCC)    Past Surgical History:  Procedure Laterality Date   ABDOMINAL HYSTERECTOMY     COLONOSCOPY  2007 and 2014   FOOT SURGERY     JOINT REPLACEMENT     ROTATOR CUFF REPAIR     TOTAL HIP ARTHROPLASTY     Patient Active Problem List   Diagnosis Date Noted   Acquired hallux rigidus 09/01/2019   Lumbar radiculopathy 09/01/2019   Spondylolisthesis 09/01/2019   Other atopic dermatitis 02/12/2019   COPD (chronic obstructive pulmonary disease) (HCC) 07/22/2018   Prediabetes 02/24/2018   Vitamin D deficiency 10/22/2016   Primary insomnia 10/20/2015   Moderate episode of recurrent major depressive disorder (HCC) 07/05/2015   Subclinical hyperthyroidism 07/05/2015   Bilateral carpal tunnel syndrome 05/31/2015   Primary osteoarthritis involving multiple joints 05/31/2015   Chronic low back pain without sciatica 05/02/2015   Cigarette nicotine dependence without complication 05/02/2015   Primary osteoarthritis of hip 05/02/2015   Back pain 02/01/2013   Hip pain 02/01/2013   FACIAL PAIN 11/28/2010   TOBACCO USER 11/16/2009    BRONCHITIS 07/17/2009   ANEMIA, IRON DEFICIENCY 06/01/2009   HYPERTENSION 06/01/2009    REFERRING DIAG: M25.512,G89.29 (ICD-10-CM) - Chronic left shoulder pain M54.2 (ICD-10-CM) - Neck pain  THERAPY DIAG:  Cervicalgia  Chronic left shoulder pain  Other low back pain  Muscle weakness (generalized)  Difficulty in walking, not elsewhere classified  Rationale for Evaluation and Treatment Rehabilitation  ONSET DATE: Approx. April 2023  SUBJECTIVE:  SUBJECTIVE STATEMENT: Pt indicated no pain complaints upon arrival this morning.  Pt indicated resting most of the weekend.    PERTINENT HISTORY:  See medical history above.  History of shots, physical therapy in back in past, Rt THA.  PAIN:  NPRS scale: neck/shoulder at worst 5/10, back 5/10.   0/10 pain upon arrival.  Pain location: neck, Lt shoulder, back Pain description: achy primary.   Aggravating factors: sleeping, lifting, carrying Relieving factors: resting arm helps, medicine at times  PRECAUTIONS: None  WEIGHT BEARING RESTRICTIONS No  FALLS:  Has patient fallen in last 6 months? Yes 1   LIVING ENVIRONMENT: Lives in: House/apartment Stairs: two story with bedroom on second story (sometimes doing one step at a time)  OCCUPATION: Disability due to back  PLOF: Independent, home care/cleaning, Rt handed.  Likes to go the gym, steam room and sauna.  Used to walk but limited at times due to back pain.   Helps take care grandkids.   PATIENT GOALS Reduce pain.   OBJECTIVE:   PATIENT SURVEYS:  07/25/2022 FOTO intake: 51   predicted:  62  COGNITION: 07/25/2022 Overall cognitive status: Within functional limits for tasks assessed  SENSATION: 07/25/2022 WFL  POSTURE:  07/25/2022  rounded shoulders and forward  head  PALPATION: 07/25/2022 Tenderness in Lt cervical paraspinals, Lt upper trap, Lt infraspinatus with trigger points noted   Spinal ROM:   No centralization/peripheralization noted c cervical or lumbar movements.  Active ROM AROM (deg) 07/25/2022  Cervical Flexion 54 c central, Lt neck pain  Cervical  Extension 58 deg c Lt cervical pain  Right lateral flexion   Left lateral flexion   Cervical  Right rotation 84 c no complaints  Cervical  Left rotation 72 c Lt cervical pain     Lumbar flexion To mid shin, pulling in back  Lumbar extension 50% WFL c ERP lumbar, improved to 75 % c 5 reps in standing ERP still noted  Rt lumbar lateral flexion Lt sided pulling  Lt lumbar lateral flexion Lt sided back pain   (Blank rows = not tested)    UPPER EXTREMITY ROM:  ROM Right  Left   Shoulder flexion WFL AROM against gravity: 120 deg c pain c end range pain  AROM in supine:  155 deg c pain end range  Shoulder extension    Shoulder abduction  AROM in supine 125 c end range pain  Shoulder adduction    Shoulder extension    Shoulder internal rotation  AROM 70 deg in supine 45 deg abduction c end range tightness noted  Shoulder external rotation  AROM 80 deg in supine 45 deg abduction  Elbow flexion    Elbow extension    Wrist flexion    Wrist extension    Wrist ulnar deviation    Wrist radial deviation    Wrist pronation    Wrist supination     (Blank rows = not tested)  UPPER EXTREMITY MMT:  MMT Right  Left   Shoulder flexion 4+/5 4/5 c pain  Shoulder extension    Shoulder abduction 5/5 4/5  Shoulder adduction    Shoulder extension    Shoulder internal rotation 5/5 5/5  Shoulder external rotation 5/5 4/5 c pain  Middle trapezius    Lower trapezius    Elbow flexion 5/5 5/5  Elbow extension 5/5 5/5  Wrist flexion    Wrist extension    Wrist ulnar deviation    Wrist radial deviation    Wrist pronation  Wrist supination    Grip strength     (Blank rows = not  tested)  SPECIAL TESTS:  07/25/2022 No specific testing was required for cervical, lumbar today.  (-) Drop arm, painful arc for Lt shoulder today  FUNCTIONAL TESTS:  07/25/2022 18 inch chair transfer:  no UE on 1st try   TODAY'S TREATMENT:  07/29/2022 Therex: UBE fwd UE/LE lvl 3.5 5 mins, reverse UE only Lvl 3.0 4 mins  Verbal Review of existing HEP.   Green band rows 2 x 15  Green band GH ext 2 x 15  Seated green band bilateral UE ER c scapular retraction 2 x 10  Supine lumbar trunk rotation 15 sec x 3 bilateral  Supine bridge 2 x 10   Supine pball press down 10 sec hold x 10   07/25/2022  Therex:    HEP instruction/performance c cues for techniques, handout provided.  Trial set performed of each for comprehension and symptom assessment.  See below for exercise list   PATIENT EDUCATION:  07/25/2022 Education details: HEP update Person educated: Patient Education method: Explanation, Demonstration, Verbal cues, and Handouts Education comprehension: verbalized understanding, returned demonstration, and verbal cues required  HOME EXERCISE PROGRAM: Access Code: [email or web address] URL: [email or web address] Date: 07/29/2022 Prepared by: Chyrel Masson  Exercises - Supine Lower Trunk Rotation  - 2-3 x daily - 7 x weekly - 1 sets - 3-5 reps - 15 hold - Supine Cervical Retraction with Towel  - 2 x daily - 7 x weekly - 1 sets - 10 reps - 5 hold - Seated Upper Trapezius Stretch (Mirrored)  - 2-3 x daily - 7 x weekly - 1 sets - 5 reps - 15 hold - Standing Scapular Retraction  - 2-3 x daily - 7 x weekly - 1 sets - 10 reps - 5 hold - Standing Shoulder Row with Anchored Resistance  - 1-2 x daily - 7 x weekly - 1-2 sets - 10-15 reps - Shoulder Extension with Resistance  - 1-2 x daily - 7 x weekly - 1-2 sets - 10-15 reps - Shoulder External Rotation and Scapular Retraction with Resistance  - 1-2 x daily - 7 x weekly - 1-2 sets - 10-15 reps - Supine Bridge  - 1-2 x daily - 7 x  weekly - 1-2 sets - 10 reps - 2 hold  ASSESSMENT:  CLINICAL IMPRESSION: Due to overall improvement in symptoms in last few days, deferred myofascial release techniques and focused more on HEP progression.   OBJECTIVE IMPAIRMENTS decreased activity tolerance, decreased coordination, decreased endurance, decreased mobility, difficulty walking, decreased ROM, decreased strength, hypomobility, increased fascial restrictions, impaired perceived functional ability, increased muscle spasms, impaired flexibility, impaired UE functional use, improper body mechanics, postural dysfunction, and pain.   ACTIVITY LIMITATIONS carrying, lifting, bending, standing, squatting, sleeping, stairs, transfers, dressing, reach over head, and locomotion level  PARTICIPATION LIMITATIONS: meal prep, cleaning, laundry, interpersonal relationship, shopping, and community activity  PERSONAL FACTORS  COPD, HTN, sickle cell trait, length of time since onset, multiple body parts  are also affecting patient's functional outcome.   REHAB POTENTIAL: Good  CLINICAL DECISION MAKING: Moderate/evolving  EVALUATION COMPLEXITY: Medium    GOALS: Goals reviewed with patient? Yes  Short term PT Goals (target date for Short term goals are 3 weeks 08/15/2022) Patient will demonstrate independent use of home exercise program to maintain progress from in clinic treatments. Goal status: on going - assessed 07/29/2022   Long term PT goals (target dates for all long term goals are  10 weeks  10/03/2022 )   1. Patient will demonstrate/report pain at worst less than or equal to 2/10 to facilitate minimal limitation in daily activity secondary to pain symptoms. Goal status: New   2. Patient will demonstrate independent use of home exercise program to facilitate ability to maintain/progress functional gains from skilled physical therapy services. Goal status: New   3. Patient will demonstrate FOTO outcome > or = 62 % to indicate reduced  disability due to condition. Goal status: New   4.  Patient will demonstrate cervical AROM WFL s symptoms to facilitate usual head movements for daily activity including driving, self care.   Goal status: New   5.  Patient will demonstrate Lt shoulder AROM WFL s symptoms for overhead reaching, lifting ability at PLOF.    Goal status: New   6.  Patient will demonstrate Lt Shoulder MMT 5/5 throughout to facilitate usual lifting/carrying at PLOF s limitation.   Goal status: New    PLAN: PT FREQUENCY: 1-2x/week  PT DURATION: 10 weeks  PLANNED INTERVENTIONS:  Therapeutic exercises, Therapeutic activity, Neuro Muscular re-education, Balance training, Gait training, Patient/Family education, Joint mobilization, Stair training, DME instructions, Dry Needling, Electrical stimulation, Cryotherapy, Traction Moist heat, Taping, Ultrasound, Ionotophoresis 4mg /ml Dexamethasone, and Manual therapy.  All included unless contraindicated  PLAN FOR NEXT SESSION: Dry needling /myofascial release Lt upper trap, shoulder as desired based off symptom presentation.  Progressive posterior chain strengthening/mobility improvements.   Chyrel Masson, PT, DPT, OCS, ATC 07/29/22  8:41 AM

## 2022-07-31 ENCOUNTER — Encounter: Payer: Self-pay | Admitting: Rehabilitative and Restorative Service Providers"

## 2022-07-31 ENCOUNTER — Ambulatory Visit (INDEPENDENT_AMBULATORY_CARE_PROVIDER_SITE_OTHER): Payer: Medicare Other | Admitting: Rehabilitative and Restorative Service Providers"

## 2022-07-31 DIAGNOSIS — M25512 Pain in left shoulder: Secondary | ICD-10-CM | POA: Diagnosis not present

## 2022-07-31 DIAGNOSIS — M6281 Muscle weakness (generalized): Secondary | ICD-10-CM

## 2022-07-31 DIAGNOSIS — R262 Difficulty in walking, not elsewhere classified: Secondary | ICD-10-CM

## 2022-07-31 DIAGNOSIS — M542 Cervicalgia: Secondary | ICD-10-CM

## 2022-07-31 DIAGNOSIS — M5459 Other low back pain: Secondary | ICD-10-CM | POA: Diagnosis not present

## 2022-07-31 DIAGNOSIS — G8929 Other chronic pain: Secondary | ICD-10-CM

## 2022-07-31 NOTE — Therapy (Signed)
OUTPATIENT PHYSICAL THERAPY TREATMENT   Patient Name: Katherine Jarvis MRN: [id number] DOB:1961-06-27, 61 y.o., female Today's Date: 07/31/2022  PCP: Solmon Ice MD  REFERRING PROVIDER: Kirtland Bouchard, PA-C  END OF SESSION:   PT End of Session - 07/31/22 0808     Visit Number 3    Number of Visits 20    Date for PT Re-Evaluation 10/03/22    Authorization Type [employer] Medicare 20% insurance    Progress Note Due on Visit 10    PT Start Time 0806    PT Stop Time 0844    PT Time Calculation (min) 38 min    Activity Tolerance Patient tolerated treatment well    Behavior During Therapy WFL for tasks assessed/performed               Past Medical History:  Diagnosis Date   Anxiety    Arthritis    Blood transfusion without reported diagnosis    COPD (chronic obstructive pulmonary disease) (HCC)    Hypertension    Sickle cell trait (HCC)    Past Surgical History:  Procedure Laterality Date   ABDOMINAL HYSTERECTOMY     COLONOSCOPY  2007 and 2014   FOOT SURGERY     JOINT REPLACEMENT     ROTATOR CUFF REPAIR     TOTAL HIP ARTHROPLASTY     Patient Active Problem List   Diagnosis Date Noted   Acquired hallux rigidus 09/01/2019   Lumbar radiculopathy 09/01/2019   Spondylolisthesis 09/01/2019   Other atopic dermatitis 02/12/2019   COPD (chronic obstructive pulmonary disease) (HCC) 07/22/2018   Prediabetes 02/24/2018   Vitamin D deficiency 10/22/2016   Primary insomnia 10/20/2015   Moderate episode of recurrent major depressive disorder (HCC) 07/05/2015   Subclinical hyperthyroidism 07/05/2015   Bilateral carpal tunnel syndrome 05/31/2015   Primary osteoarthritis involving multiple joints 05/31/2015   Chronic low back pain without sciatica 05/02/2015   Cigarette nicotine dependence without complication 05/02/2015   Primary osteoarthritis of hip 05/02/2015   Back pain 02/01/2013   Hip pain 02/01/2013   FACIAL PAIN 11/28/2010   TOBACCO USER 11/16/2009    BRONCHITIS 07/17/2009   ANEMIA, IRON DEFICIENCY 06/01/2009   HYPERTENSION 06/01/2009    REFERRING DIAG: M25.512,G89.29 (ICD-10-CM) - Chronic left shoulder pain M54.2 (ICD-10-CM) - Neck pain  THERAPY DIAG:  Cervicalgia  Chronic left shoulder pain  Other low back pain  Muscle weakness (generalized)  Difficulty in walking, not elsewhere classified  Rationale for Evaluation and Treatment Rehabilitation  ONSET DATE: Approx. April 2023  SUBJECTIVE:  SUBJECTIVE STATEMENT: Pt reported no pain upon arrrival today and feeling like the muscles are getting better. Pt indicated no complaints with additions to home program.  Pt indicated "tender in back".   PERTINENT HISTORY:  See medical history above.  History of shots, physical therapy in back in past, Rt THA.  PAIN:  NPRS scale: 0/10 upon arrival.  Pain location: neck, Lt shoulder, back Pain description: achy primary.   Aggravating factors: sleeping, lifting, carrying Relieving factors: resting arm helps, medicine at times  PRECAUTIONS: None  WEIGHT BEARING RESTRICTIONS No  FALLS:  Has patient fallen in last 6 months? Yes 1   LIVING ENVIRONMENT: Lives in: House/apartment Stairs: two story with bedroom on second story (sometimes doing one step at a time)  OCCUPATION: Disability due to back  PLOF: Independent, home care/cleaning, Rt handed.  Likes to go the gym, steam room and sauna.  Used to walk but limited at times due to back pain.   Helps take care grandkids.   PATIENT GOALS Reduce pain.   OBJECTIVE:   PATIENT SURVEYS:  07/25/2022 FOTO intake: 51   predicted:  62  COGNITION: 07/25/2022 Overall cognitive status: Within functional limits for tasks assessed  SENSATION: 07/25/2022 WFL  POSTURE:  07/25/2022   rounded shoulders and forward head  PALPATION: 07/25/2022 Tenderness in Lt cervical paraspinals, Lt upper trap, Lt infraspinatus with trigger points noted   Spinal ROM:   No centralization/peripheralization noted c cervical or lumbar movements.  Active ROM AROM (deg) 07/25/2022 AROM 07/31/2022  Cervical Flexion 54 c central, Lt neck pain 56  Cervical  Extension 58 deg c Lt cervical pain 65 c cervical pain  Right lateral flexion    Left lateral flexion    Cervical  Right rotation 84 c no complaints 85  Cervical  Left rotation 72 c Lt cervical pain 80      Lumbar flexion To mid shin, pulling in back   Lumbar extension 50% WFL c ERP lumbar, improved to 75 % c 5 reps in standing ERP still noted   Rt lumbar lateral flexion Lt sided pulling   Lt lumbar lateral flexion Lt sided back pain    (Blank rows = not tested)    UPPER EXTREMITY ROM:  ROM Right 07/25/2022 Left 07/25/2022  Shoulder flexion WFL AROM against gravity: 120 deg c pain c end range pain  AROM in supine:  155 deg c pain end range  Shoulder extension    Shoulder abduction  AROM in supine 125 c end range pain  Shoulder adduction    Shoulder extension    Shoulder internal rotation  AROM 70 deg in supine 45 deg abduction c end range tightness noted  Shoulder external rotation  AROM 80 deg in supine 45 deg abduction  Elbow flexion    Elbow extension    Wrist flexion    Wrist extension    Wrist ulnar deviation    Wrist radial deviation    Wrist pronation    Wrist supination     (Blank rows = not tested)  UPPER EXTREMITY MMT:  MMT Right 07/25/2022 Left 07/25/2022  Shoulder flexion 4+/5 4/5 c pain  Shoulder extension    Shoulder abduction 5/5 4/5  Shoulder adduction    Shoulder extension    Shoulder internal rotation 5/5 5/5  Shoulder external rotation 5/5 4/5 c pain  Middle trapezius    Lower trapezius    Elbow flexion 5/5 5/5  Elbow extension 5/5 5/5  Wrist flexion    Wrist  extension    Wrist  ulnar deviation    Wrist radial deviation    Wrist pronation    Wrist supination    Grip strength     (Blank rows = not tested)  SPECIAL TESTS:  07/25/2022 No specific testing was required for cervical, lumbar today.  (-) Drop arm, painful arc for Lt shoulder today  FUNCTIONAL TESTS:  07/25/2022 18 inch chair transfer:  no UE on 1st try   TODAY'S TREATMENT:  07/31/2022 Therex: UBE fwd UE/LE lvl 3.5 5 mins, reverse UE only Lvl 3.0 4 mins  Green band rows x20  Green band GH ext x20  Standing single arm ER c towel at side green 2 x 10, performed bilaterally  Supine lumbar trunk rotation 15 sec x 3 bilateral  Supine bridge 2-3 sec hold 2 x 10  Supine clam shell green band 20 x bilateral c isometric hold contralateral leg  Supine isometric hip flexion c ipsilateral UE hold against 5 sec hold x 10 bilateral  Supine figure 4 pull toward stretch 15 sec hold x 3 bilateral  Nustep Lvl 6 10 mins UE/LE  07/29/2022 Therex: UBE fwd UE/LE lvl 3.5 5 mins, reverse UE only Lvl 3.0 4 mins  Verbal Review of existing HEP.   Green band rows 2 x 15  Green band GH ext 2 x 15  Seated green band bilateral UE ER c scapular retraction 2 x 10  Supine lumbar trunk rotation 15 sec x 3 bilateral  Supine bridge 2 x 10   Supine pball press down 10 sec hold x 10   07/25/2022  Therex:    HEP instruction/performance c cues for techniques, handout provided.  Trial set performed of each for comprehension and symptom assessment.  See below for exercise list   PATIENT EDUCATION:  07/25/2022 Education details: HEP update Person educated: Patient Education method: Explanation, Demonstration, Verbal cues, and Handouts Education comprehension: verbalized understanding, returned demonstration, and verbal cues required  HOME EXERCISE PROGRAM: Access Code: [email or web address] URL: [email or web address] Date: 07/29/2022 Prepared by: Chyrel Masson  Exercises - Supine Lower Trunk Rotation  - 2-3 x  daily - 7 x weekly - 1 sets - 3-5 reps - 15 hold - Supine Cervical Retraction with Towel  - 2 x daily - 7 x weekly - 1 sets - 10 reps - 5 hold - Seated Upper Trapezius Stretch (Mirrored)  - 2-3 x daily - 7 x weekly - 1 sets - 5 reps - 15 hold - Standing Scapular Retraction  - 2-3 x daily - 7 x weekly - 1 sets - 10 reps - 5 hold - Standing Shoulder Row with Anchored Resistance  - 1-2 x daily - 7 x weekly - 1-2 sets - 10-15 reps - Shoulder Extension with Resistance  - 1-2 x daily - 7 x weekly - 1-2 sets - 10-15 reps - Shoulder External Rotation and Scapular Retraction with Resistance  - 1-2 x daily - 7 x weekly - 1-2 sets - 10-15 reps - Supine Bridge  - 1-2 x daily - 7 x weekly - 1-2 sets - 10 reps - 2 hold  ASSESSMENT:  CLINICAL IMPRESSION: Continued work on improved posterior chain and shoulder strengthening.  Fatigue noted in posterior rotator cuff strengthening interventions today.  Continued skilled PT services to improve strength indicated at this time.  Check of AROM for cervical showed improvements.   Lumbar /Rt hip mobility seems to be part of main complaint at this time.   OBJECTIVE IMPAIRMENTS decreased activity  tolerance, decreased coordination, decreased endurance, decreased mobility, difficulty walking, decreased ROM, decreased strength, hypomobility, increased fascial restrictions, impaired perceived functional ability, increased muscle spasms, impaired flexibility, impaired UE functional use, improper body mechanics, postural dysfunction, and pain.   ACTIVITY LIMITATIONS carrying, lifting, bending, standing, squatting, sleeping, stairs, transfers, dressing, reach over head, and locomotion level  PARTICIPATION LIMITATIONS: meal prep, cleaning, laundry, interpersonal relationship, shopping, and community activity  PERSONAL FACTORS  COPD, HTN, sickle cell trait, length of time since onset, multiple body parts  are also affecting patient's functional outcome.   REHAB POTENTIAL:  Good  CLINICAL DECISION MAKING: Moderate/evolving  EVALUATION COMPLEXITY: Medium    GOALS: Goals reviewed with patient? Yes  Short term PT Goals (target date for Short term goals are 3 weeks 08/15/2022) Patient will demonstrate independent use of home exercise program to maintain progress from in clinic treatments. Goal status: on going - assessed 07/29/2022   Long term PT goals (target dates for all long term goals are 10 weeks  10/03/2022 )   1. Patient will demonstrate/report pain at worst less than or equal to 2/10 to facilitate minimal limitation in daily activity secondary to pain symptoms. Goal status: New   2. Patient will demonstrate independent use of home exercise program to facilitate ability to maintain/progress functional gains from skilled physical therapy services. Goal status: New   3. Patient will demonstrate FOTO outcome > or = 62 % to indicate reduced disability due to condition. Goal status: New   4.  Patient will demonstrate cervical AROM WFL s symptoms to facilitate usual head movements for daily activity including driving, self care.   Goal status: New   5.  Patient will demonstrate Lt shoulder AROM WFL s symptoms for overhead reaching, lifting ability at PLOF.    Goal status: New   6.  Patient will demonstrate Lt Shoulder MMT 5/5 throughout to facilitate usual lifting/carrying at PLOF s limitation.   Goal status: New    PLAN: PT FREQUENCY: 1-2x/week  PT DURATION: 10 weeks  PLANNED INTERVENTIONS:  Therapeutic exercises, Therapeutic activity, Neuro Muscular re-education, Balance training, Gait training, Patient/Family education, Joint mobilization, Stair training, DME instructions, Dry Needling, Electrical stimulation, Cryotherapy, Traction Moist heat, Taping, Ultrasound, Ionotophoresis 4mg /ml Dexamethasone, and Manual therapy.  All included unless contraindicated  PLAN FOR NEXT SESSION:  Focus more on lumbar region based off symptom presentation.  Progressive posterior chain strengthening/mobility improvements.   Chyrel Masson, PT, DPT, OCS, ATC 07/31/22  8:39 AM

## 2022-08-07 ENCOUNTER — Encounter: Payer: Self-pay | Admitting: Rehabilitative and Restorative Service Providers"

## 2022-08-07 ENCOUNTER — Ambulatory Visit (INDEPENDENT_AMBULATORY_CARE_PROVIDER_SITE_OTHER): Payer: Medicare Other | Admitting: Rehabilitative and Restorative Service Providers"

## 2022-08-07 DIAGNOSIS — R262 Difficulty in walking, not elsewhere classified: Secondary | ICD-10-CM

## 2022-08-07 DIAGNOSIS — M25512 Pain in left shoulder: Secondary | ICD-10-CM | POA: Diagnosis not present

## 2022-08-07 DIAGNOSIS — M6281 Muscle weakness (generalized): Secondary | ICD-10-CM

## 2022-08-07 DIAGNOSIS — M542 Cervicalgia: Secondary | ICD-10-CM | POA: Diagnosis not present

## 2022-08-07 DIAGNOSIS — M5459 Other low back pain: Secondary | ICD-10-CM | POA: Diagnosis not present

## 2022-08-07 DIAGNOSIS — G8929 Other chronic pain: Secondary | ICD-10-CM

## 2022-08-07 NOTE — Therapy (Signed)
OUTPATIENT PHYSICAL THERAPY TREATMENT   Patient Name: Katherine Jarvis MRN: [id number] DOB:December 21, 1960, 61 y.o., female Today's Date: 08/07/2022  PCP: Solmon Ice MD  REFERRING PROVIDER: Kirtland Bouchard, PA-C  END OF SESSION:   PT End of Session - 08/07/22 0937     Visit Number 4    Number of Visits 20    Date for PT Re-Evaluation 10/03/22    Authorization Type [employer] Medicare 20% insurance    Progress Note Due on Visit 10    PT Start Time 0935    PT Stop Time 1014    PT Time Calculation (min) 39 min    Activity Tolerance Patient tolerated treatment well    Behavior During Therapy WFL for tasks assessed/performed                Past Medical History:  Diagnosis Date   Anxiety    Arthritis    Blood transfusion without reported diagnosis    COPD (chronic obstructive pulmonary disease) (HCC)    Hypertension    Sickle cell trait (HCC)    Past Surgical History:  Procedure Laterality Date   ABDOMINAL HYSTERECTOMY     COLONOSCOPY  2007 and 2014   FOOT SURGERY     JOINT REPLACEMENT     ROTATOR CUFF REPAIR     TOTAL HIP ARTHROPLASTY     Patient Active Problem List   Diagnosis Date Noted   Acquired hallux rigidus 09/01/2019   Lumbar radiculopathy 09/01/2019   Spondylolisthesis 09/01/2019   Other atopic dermatitis 02/12/2019   COPD (chronic obstructive pulmonary disease) (HCC) 07/22/2018   Prediabetes 02/24/2018   Vitamin D deficiency 10/22/2016   Primary insomnia 10/20/2015   Moderate episode of recurrent major depressive disorder (HCC) 07/05/2015   Subclinical hyperthyroidism 07/05/2015   Bilateral carpal tunnel syndrome 05/31/2015   Primary osteoarthritis involving multiple joints 05/31/2015   Chronic low back pain without sciatica 05/02/2015   Cigarette nicotine dependence without complication 05/02/2015   Primary osteoarthritis of hip 05/02/2015   Back pain 02/01/2013   Hip pain 02/01/2013   FACIAL PAIN 11/28/2010   TOBACCO USER 11/16/2009    BRONCHITIS 07/17/2009   ANEMIA, IRON DEFICIENCY 06/01/2009   HYPERTENSION 06/01/2009    REFERRING DIAG: M25.512,G89.29 (ICD-10-CM) - Chronic left shoulder pain M54.2 (ICD-10-CM) - Neck pain  THERAPY DIAG:  Cervicalgia  Chronic left shoulder pain  Other low back pain  Muscle weakness (generalized)  Difficulty in walking, not elsewhere classified  Rationale for Evaluation and Treatment Rehabilitation  ONSET DATE: Approx. April 2023  SUBJECTIVE:  SUBJECTIVE STATEMENT: Pt indicated neck has been doing well.  Pt stated her exercises and the steam room helps her neck feel better.   Pt indicated back always feels stiff.    PERTINENT HISTORY:  See medical history above.  History of shots, physical therapy in back in past, Rt THA.  PAIN:  NPRS scale: 0/10 upon arrival for neck.  Pain location: neck, Lt shoulder, back Pain description: achy primary.   Aggravating factors: sleeping, lifting, carrying Relieving factors: resting arm helps, medicine at times  PRECAUTIONS: None  WEIGHT BEARING RESTRICTIONS No  FALLS:  Has patient fallen in last 6 months? Yes 1   LIVING ENVIRONMENT: Lives in: House/apartment Stairs: two story with bedroom on second story (sometimes doing one step at a time)  OCCUPATION: Disability due to back  PLOF: Independent, home care/cleaning, Rt handed.  Likes to go the gym, steam room and sauna.  Used to walk but limited at times due to back pain.   Helps take care grandkids.   PATIENT GOALS Reduce pain.   OBJECTIVE:   PATIENT SURVEYS:  08/07/2022:   FOTO update: 53  07/25/2022 FOTO intake: 51   predicted:  62  COGNITION: 07/25/2022 Overall cognitive status: Within functional limits for tasks assessed  SENSATION: 07/25/2022 WFL  POSTURE:   07/25/2022  rounded shoulders and forward head  PALPATION: 07/25/2022 Tenderness in Lt cervical paraspinals, Lt upper trap, Lt infraspinatus with trigger points noted   Spinal ROM:   No centralization/peripheralization noted c cervical or lumbar movements.  Active ROM AROM (deg) 07/25/2022 AROM 07/31/2022 AROM 08/07/2022  Cervical Flexion 54 c central, Lt neck pain 56   Cervical  Extension 58 deg c Lt cervical pain 65 c cervical pain   Right lateral flexion     Left lateral flexion     Cervical  Right rotation 84 c no complaints 85   Cervical  Left rotation 72 c Lt cervical pain 80        Lumbar flexion To mid shin, pulling in back    Lumbar extension 50% WFL c ERP lumbar, improved to 75 % c 5 reps in standing ERP still noted  75% WFL in back  Rt lumbar lateral flexion Lt sided pulling    Lt lumbar lateral flexion Lt sided back pain     (Blank rows = not tested)    UPPER EXTREMITY ROM:  ROM Right 07/25/2022 Left 07/25/2022  Shoulder flexion WFL AROM against gravity: 120 deg c pain c end range pain  AROM in supine:  155 deg c pain end range  Shoulder extension    Shoulder abduction  AROM in supine 125 c end range pain  Shoulder adduction    Shoulder extension    Shoulder internal rotation  AROM 70 deg in supine 45 deg abduction c end range tightness noted  Shoulder external rotation  AROM 80 deg in supine 45 deg abduction  Elbow flexion    Elbow extension    Wrist flexion    Wrist extension    Wrist ulnar deviation    Wrist radial deviation    Wrist pronation    Wrist supination     (Blank rows = not tested)  UPPER EXTREMITY MMT:  MMT Right 07/25/2022 Left 07/25/2022  Shoulder flexion 4+/5 4/5 c pain  Shoulder extension    Shoulder abduction 5/5 4/5  Shoulder adduction    Shoulder extension    Shoulder internal rotation 5/5 5/5  Shoulder external rotation 5/5 4/5 c pain  Middle  trapezius    Lower trapezius    Elbow flexion 5/5 5/5  Elbow extension  5/5 5/5  Wrist flexion    Wrist extension    Wrist ulnar deviation    Wrist radial deviation    Wrist pronation    Wrist supination    Grip strength     (Blank rows = not tested)  SPECIAL TESTS:  07/25/2022 No specific testing was required for cervical, lumbar today.  (-) Drop arm, painful arc for Lt shoulder today  FUNCTIONAL TESTS:  07/25/2022 18 inch chair transfer:  no UE on 1st try   TODAY'S TREATMENT:  07/28/2022 Therex: Standing lumbar extension x 5 (cues for home use during the day)  Supine lumbar trunk rotation 15 sec x 3 bilateral  Supine bridge 2-3 sec hold x15  Supine clam shell green band 20 x bilateral c isometric hold contralateral leg  Prone opposite arm/leg lift 2-3 sec hold x 10 bilateral  Seated multifidi isometric UE press under table 5 sec hold x 10   Nustep Lvl 6 9 mins UE/LE  07/31/2022 Therex: UBE fwd UE/LE lvl 3.5 5 mins, reverse UE only Lvl 3.0 4 mins  Green band rows x20  Green band GH ext x20  Standing single arm ER c towel at side green 2 x 10, performed bilaterally  Supine lumbar trunk rotation 15 sec x 3 bilateral  Supine bridge 2-3 sec hold 2 x 10  Supine clam shell green band 20 x bilateral c isometric hold contralateral leg  Supine isometric hip flexion c ipsilateral UE hold against 5 sec hold x 10 bilateral  Supine figure 4 pull toward stretch 15 sec hold x 3 bilateral  Nustep Lvl 6 10 mins UE/LE  07/29/2022 Therex: UBE fwd UE/LE lvl 3.5 5 mins, reverse UE only Lvl 3.0 4 mins  Verbal Review of existing HEP.   Green band rows 2 x 15  Green band GH ext 2 x 15  Seated green band bilateral UE ER c scapular retraction 2 x 10  Supine lumbar trunk rotation 15 sec x 3 bilateral  Supine bridge 2 x 10   Supine pball press down 10 sec hold x 10   PATIENT EDUCATION:  08/07/2022 Education details: HEP update for lumbar Person educated: Patient Education method: Explanation, Demonstration, Verbal cues, and Handouts Education  comprehension: verbalized understanding, returned demonstration, and verbal cues required  HOME EXERCISE PROGRAM: Access Code: [email or web address] URL: [email or web address] Date: 08/07/2022 Prepared by: Chyrel Masson  Exercises - Supine Cervical Retraction with Towel  - 2 x daily - 7 x weekly - 1 sets - 10 reps - 5 hold - Seated Upper Trapezius Stretch (Mirrored)  - 2-3 x daily - 7 x weekly - 1 sets - 5 reps - 15 hold - Standing Scapular Retraction  - 2-3 x daily - 7 x weekly - 1 sets - 10 reps - 5 hold - Standing Shoulder Row with Anchored Resistance  - 1-2 x daily - 7 x weekly - 1-2 sets - 10-15 reps - Shoulder Extension with Resistance  - 1-2 x daily - 7 x weekly - 1-2 sets - 10-15 reps - Shoulder External Rotation and Scapular Retraction with Resistance  - 1-2 x daily - 7 x weekly - 1-2 sets - 10-15 reps - Supine Lower Trunk Rotation  - 2-3 x daily - 7 x weekly - 1 sets - 3-5 reps - 15 hold - Supine Bridge  - 1-2 x daily - 7 x weekly - 1-2 sets -  10 reps - 2 hold - Standing Lumbar Extension with Counter  - 3-5 x daily - 7 x weekly - 1 sets - 5-10 reps - Seated Multifidi Isometric  - 2-3 x daily - 7 x weekly - 1 sets - 10 reps - 5-10 hold - Prone Alternating Arm and Leg Lifts  - 1-2 x daily - 7 x weekly - 1-2 sets - 10 reps - 3 hold  ASSESSMENT:  CLINICAL IMPRESSION: Discussion c patient and review of FOTO update indicated involvement of leg strength (particularly Rt leg) in ability to perform daily activity.  This weakness may negatively impact low back presentation during functional activity.  Continued skilled PT services indicated at this time to address and improve.   OBJECTIVE IMPAIRMENTS decreased activity tolerance, decreased coordination, decreased endurance, decreased mobility, difficulty walking, decreased ROM, decreased strength, hypomobility, increased fascial restrictions, impaired perceived functional ability, increased muscle spasms, impaired flexibility, impaired UE  functional use, improper body mechanics, postural dysfunction, and pain.   ACTIVITY LIMITATIONS carrying, lifting, bending, standing, squatting, sleeping, stairs, transfers, dressing, reach over head, and locomotion level  PARTICIPATION LIMITATIONS: meal prep, cleaning, laundry, interpersonal relationship, shopping, and community activity  PERSONAL FACTORS  COPD, HTN, sickle cell trait, length of time since onset, multiple body parts  are also affecting patient's functional outcome.   REHAB POTENTIAL: Good  CLINICAL DECISION MAKING: Moderate/evolving  EVALUATION COMPLEXITY: Medium    GOALS: Goals reviewed with patient? Yes  Short term PT Goals (target date for Short term goals are 3 weeks 08/15/2022) Patient will demonstrate independent use of home exercise program to maintain progress from in clinic treatments. Goal status: on going - assessed 07/29/2022   Long term PT goals (target dates for all long term goals are 10 weeks  10/03/2022 )   1. Patient will demonstrate/report pain at worst less than or equal to 2/10 to facilitate minimal limitation in daily activity secondary to pain symptoms. Goal status: on going - assessed 08/07/2022   2. Patient will demonstrate independent use of home exercise program to facilitate ability to maintain/progress functional gains from skilled physical therapy services. Goal status: on going - assessed 08/07/2022   3. Patient will demonstrate FOTO outcome > or = 62 % to indicate reduced disability due to condition. Goal status: on going - assessed 10/25/2023on going - assessed 08/07/2022   4.  Patient will demonstrate cervical AROM WFL s symptoms to facilitate usual head movements for daily activity including driving, self care.   Goal status: on going - assessed 08/07/2022   5.  Patient will demonstrate Lt shoulder AROM WFL s symptoms for overhead reaching, lifting ability at PLOF.    Goal status: on going - assessed 08/07/2022   6.  Patient  will demonstrate Lt Shoulder MMT 5/5 throughout to facilitate usual lifting/carrying at PLOF s limitation.   Goal status: on going - assessed 08/07/2022    PLAN: PT FREQUENCY: 1-2x/week  PT DURATION: 10 weeks  PLANNED INTERVENTIONS:  Therapeutic exercises, Therapeutic activity, Neuro Muscular re-education, Balance training, Gait training, Patient/Family education, Joint mobilization, Stair training, DME instructions, Dry Needling, Electrical stimulation, Cryotherapy, Traction Moist heat, Taping, Ultrasound, Ionotophoresis 4mg /ml Dexamethasone, and Manual therapy.  All included unless contraindicated  PLAN FOR NEXT SESSION:  Low back and LE focus based off symptom report.    Chyrel Masson, PT, DPT, OCS, ATC 08/07/22  10:16 AM

## 2022-08-09 ENCOUNTER — Encounter: Payer: Self-pay | Admitting: Rehabilitative and Restorative Service Providers"

## 2022-08-09 ENCOUNTER — Ambulatory Visit (INDEPENDENT_AMBULATORY_CARE_PROVIDER_SITE_OTHER): Payer: Medicare Other | Admitting: Rehabilitative and Restorative Service Providers"

## 2022-08-09 DIAGNOSIS — M5459 Other low back pain: Secondary | ICD-10-CM | POA: Diagnosis not present

## 2022-08-09 DIAGNOSIS — M542 Cervicalgia: Secondary | ICD-10-CM | POA: Diagnosis not present

## 2022-08-09 DIAGNOSIS — G8929 Other chronic pain: Secondary | ICD-10-CM

## 2022-08-09 DIAGNOSIS — M25512 Pain in left shoulder: Secondary | ICD-10-CM | POA: Diagnosis not present

## 2022-08-09 DIAGNOSIS — M6281 Muscle weakness (generalized): Secondary | ICD-10-CM | POA: Diagnosis not present

## 2022-08-09 DIAGNOSIS — R262 Difficulty in walking, not elsewhere classified: Secondary | ICD-10-CM

## 2022-08-09 NOTE — Therapy (Signed)
OUTPATIENT PHYSICAL THERAPY TREATMENT   Patient Name: Katherine Jarvis MRN: [id number] DOB:Mar 09, 1961, 61 y.o., female Today's Date: 08/09/2022  PCP: Solmon Ice MD  REFERRING PROVIDER: Kirtland Bouchard, PA-C  END OF SESSION:   PT End of Session - 08/09/22 1111     Visit Number 5    Number of Visits 20    Date for PT Re-Evaluation 10/03/22    Authorization Type [employer] Medicare 20% insurance    Progress Note Due on Visit 10    PT Start Time 1102    PT Stop Time 1141    PT Time Calculation (min) 39 min    Activity Tolerance Patient tolerated treatment well    Behavior During Therapy WFL for tasks assessed/performed                 Past Medical History:  Diagnosis Date   Anxiety    Arthritis    Blood transfusion without reported diagnosis    COPD (chronic obstructive pulmonary disease) (HCC)    Hypertension    Sickle cell trait (HCC)    Past Surgical History:  Procedure Laterality Date   ABDOMINAL HYSTERECTOMY     COLONOSCOPY  2007 and 2014   FOOT SURGERY     JOINT REPLACEMENT     ROTATOR CUFF REPAIR     TOTAL HIP ARTHROPLASTY     Patient Active Problem List   Diagnosis Date Noted   Acquired hallux rigidus 09/01/2019   Lumbar radiculopathy 09/01/2019   Spondylolisthesis 09/01/2019   Other atopic dermatitis 02/12/2019   COPD (chronic obstructive pulmonary disease) (HCC) 07/22/2018   Prediabetes 02/24/2018   Vitamin D deficiency 10/22/2016   Primary insomnia 10/20/2015   Moderate episode of recurrent major depressive disorder (HCC) 07/05/2015   Subclinical hyperthyroidism 07/05/2015   Bilateral carpal tunnel syndrome 05/31/2015   Primary osteoarthritis involving multiple joints 05/31/2015   Chronic low back pain without sciatica 05/02/2015   Cigarette nicotine dependence without complication 05/02/2015   Primary osteoarthritis of hip 05/02/2015   Back pain 02/01/2013   Hip pain 02/01/2013   FACIAL PAIN 11/28/2010   TOBACCO USER 11/16/2009    BRONCHITIS 07/17/2009   ANEMIA, IRON DEFICIENCY 06/01/2009   HYPERTENSION 06/01/2009    REFERRING DIAG: M25.512,G89.29 (ICD-10-CM) - Chronic left shoulder pain M54.2 (ICD-10-CM) - Neck pain  THERAPY DIAG:  Cervicalgia  Chronic left shoulder pain  Other low back pain  Muscle weakness (generalized)  Difficulty in walking, not elsewhere classified  Rationale for Evaluation and Treatment Rehabilitation  ONSET DATE: Approx. April 2023  SUBJECTIVE:  SUBJECTIVE STATEMENT: Pt indicated feeling some pain in back yesterday after being active all day.  Reported ironing a lot.  Pt indicated taking medicine and doing some stretching at home and went to sleep.    Pt indicated "a little bit of pain" in back today upon arrival.   PERTINENT HISTORY:  See medical history above.  History of shots, physical therapy in back in past, Rt THA.  PAIN:  NPRS scale: 7/10  Pain location: back Pain description: achy primary.   Aggravating factors: sleeping, lifting, carrying Relieving factors: resting arm helps, medicine at times  PRECAUTIONS: None  WEIGHT BEARING RESTRICTIONS No  FALLS:  Has patient fallen in last 6 months? Yes 1   LIVING ENVIRONMENT: Lives in: House/apartment Stairs: two story with bedroom on second story (sometimes doing one step at a time)  OCCUPATION: Disability due to back  PLOF: Independent, home care/cleaning, Rt handed.  Likes to go the gym, steam room and sauna.  Used to walk but limited at times due to back pain.   Helps take care grandkids.   PATIENT GOALS Reduce pain.   OBJECTIVE:   PATIENT SURVEYS:  08/07/2022:   FOTO update: 53  07/25/2022 FOTO intake: 51   predicted:  62  COGNITION: 07/25/2022 Overall cognitive status: Within functional limits for  tasks assessed  SENSATION: 07/25/2022 WFL  POSTURE:  07/25/2022  rounded shoulders and forward head  PALPATION: 07/25/2022 Tenderness in Lt cervical paraspinals, Lt upper trap, Lt infraspinatus with trigger points noted   Spinal ROM:   No centralization/peripheralization noted c cervical or lumbar movements.  Active ROM AROM (deg) 07/25/2022 AROM 07/31/2022 AROM 08/07/2022  Cervical Flexion 54 c central, Lt neck pain 56   Cervical  Extension 58 deg c Lt cervical pain 65 c cervical pain   Right lateral flexion     Left lateral flexion     Cervical  Right rotation 84 c no complaints 85   Cervical  Left rotation 72 c Lt cervical pain 80        Lumbar flexion To mid shin, pulling in back    Lumbar extension 50% WFL c ERP lumbar, improved to 75 % c 5 reps in standing ERP still noted  75% WFL in back  Rt lumbar lateral flexion Lt sided pulling    Lt lumbar lateral flexion Lt sided back pain     (Blank rows = not tested)    UPPER EXTREMITY ROM:  ROM Right 07/25/2022 Left 07/25/2022  Shoulder flexion WFL AROM against gravity: 120 deg c pain c end range pain  AROM in supine:  155 deg c pain end range  Shoulder extension    Shoulder abduction  AROM in supine 125 c end range pain  Shoulder adduction    Shoulder extension    Shoulder internal rotation  AROM 70 deg in supine 45 deg abduction c end range tightness noted  Shoulder external rotation  AROM 80 deg in supine 45 deg abduction  Elbow flexion    Elbow extension    Wrist flexion    Wrist extension    Wrist ulnar deviation    Wrist radial deviation    Wrist pronation    Wrist supination     (Blank rows = not tested)  UPPER EXTREMITY MMT:  MMT Right 07/25/2022 Left 07/25/2022  Shoulder flexion 4+/5 4/5 c pain  Shoulder extension    Shoulder abduction 5/5 4/5  Shoulder adduction    Shoulder extension    Shoulder internal rotation 5/5  5/5  Shoulder external rotation 5/5 4/5 c pain  Middle trapezius     Lower trapezius    Elbow flexion 5/5 5/5  Elbow extension 5/5 5/5  Wrist flexion    Wrist extension    Wrist ulnar deviation    Wrist radial deviation    Wrist pronation    Wrist supination    Grip strength     (Blank rows = not tested)  SPECIAL TESTS:  07/25/2022 No specific testing was required for cervical, lumbar today.  (-) Drop arm, painful arc for Lt shoulder today  FUNCTIONAL TESTS:  07/25/2022 18 inch chair transfer:  no UE on 1st try   TODAY'S TREATMENT:  08/09/2022 Therex: UBE LE only lvl 4.0 8 mins Leg extension machine double leg up , single leg lowering 15 lbs 2 x 10 bilateral Standing rows , gh ext blue band x 20 each c focus on steady core Leg press single leg performance 2 x 15 bilateral 62 lbs  Prone opposite arm/leg lift 2-3 sec hold x 15 bilateral Standing lumbar extension x 5   Supine lumbar trunk rotation 15 sec x 3 bilateral  Supine bridge 2-3 sec hold x 10      07/31/2022 Therex: UBE fwd UE/LE lvl 3.5 5 mins, reverse UE only Lvl 3.0 4 mins  Green band rows x20  Green band GH ext x20  Standing single arm ER c towel at side green 2 x 10, performed bilaterally  Supine lumbar trunk rotation 15 sec x 3 bilateral  Supine bridge 2-3 sec hold 2 x 10  Supine clam shell green band 20 x bilateral c isometric hold contralateral leg  Supine isometric hip flexion c ipsilateral UE hold against 5 sec hold x 10 bilateral  Supine figure 4 pull toward stretch 15 sec hold x 3 bilateral  Nustep Lvl 6 10 mins UE/LE   PATIENT EDUCATION:  08/07/2022 Education details: HEP update for lumbar Person educated: Patient Education method: Explanation, Demonstration, Verbal cues, and Handouts Education comprehension: verbalized understanding, returned demonstration, and verbal cues required  HOME EXERCISE PROGRAM: Access Code: [email or web address] URL: [email or web address] Date: 08/07/2022 Prepared by: Chyrel Masson  Exercises - Supine Cervical Retraction  with Towel  - 2 x daily - 7 x weekly - 1 sets - 10 reps - 5 hold - Seated Upper Trapezius Stretch (Mirrored)  - 2-3 x daily - 7 x weekly - 1 sets - 5 reps - 15 hold - Standing Scapular Retraction  - 2-3 x daily - 7 x weekly - 1 sets - 10 reps - 5 hold - Standing Shoulder Row with Anchored Resistance  - 1-2 x daily - 7 x weekly - 1-2 sets - 10-15 reps - Shoulder Extension with Resistance  - 1-2 x daily - 7 x weekly - 1-2 sets - 10-15 reps - Shoulder External Rotation and Scapular Retraction with Resistance  - 1-2 x daily - 7 x weekly - 1-2 sets - 10-15 reps - Supine Lower Trunk Rotation  - 2-3 x daily - 7 x weekly - 1 sets - 3-5 reps - 15 hold - Supine Bridge  - 1-2 x daily - 7 x weekly - 1-2 sets - 10 reps - 2 hold - Standing Lumbar Extension with Counter  - 3-5 x daily - 7 x weekly - 1 sets - 5-10 reps - Seated Multifidi Isometric  - 2-3 x daily - 7 x weekly - 1 sets - 10 reps - 5-10 hold - Prone Alternating Arm and Leg  Lifts  - 1-2 x daily - 7 x weekly - 1-2 sets - 10 reps - 3 hold  ASSESSMENT:  CLINICAL IMPRESSION: Continued progress of progressive core/leg strengthening to improve functional movement pattern/tolerance.   OBJECTIVE IMPAIRMENTS decreased activity tolerance, decreased coordination, decreased endurance, decreased mobility, difficulty walking, decreased ROM, decreased strength, hypomobility, increased fascial restrictions, impaired perceived functional ability, increased muscle spasms, impaired flexibility, impaired UE functional use, improper body mechanics, postural dysfunction, and pain.   ACTIVITY LIMITATIONS carrying, lifting, bending, standing, squatting, sleeping, stairs, transfers, dressing, reach over head, and locomotion level  PARTICIPATION LIMITATIONS: meal prep, cleaning, laundry, interpersonal relationship, shopping, and community activity  PERSONAL FACTORS  COPD, HTN, sickle cell trait, length of time since onset, multiple body parts  are also affecting patient's  functional outcome.   REHAB POTENTIAL: Good  CLINICAL DECISION MAKING: Moderate/evolving  EVALUATION COMPLEXITY: Medium    GOALS: Goals reviewed with patient? Yes  Short term PT Goals (target date for Short term goals are 3 weeks 08/15/2022) Patient will demonstrate independent use of home exercise program to maintain progress from in clinic treatments. Goal status: on going - assessed 07/29/2022   Long term PT goals (target dates for all long term goals are 10 weeks  10/03/2022 )   1. Patient will demonstrate/report pain at worst less than or equal to 2/10 to facilitate minimal limitation in daily activity secondary to pain symptoms. Goal status: on going - assessed 08/07/2022   2. Patient will demonstrate independent use of home exercise program to facilitate ability to maintain/progress functional gains from skilled physical therapy services. Goal status: on going - assessed 08/07/2022   3. Patient will demonstrate FOTO outcome > or = 62 % to indicate reduced disability due to condition. Goal status: on going - assessed 10/25/2023on going - assessed 08/07/2022   4.  Patient will demonstrate cervical AROM WFL s symptoms to facilitate usual head movements for daily activity including driving, self care.   Goal status: on going - assessed 08/07/2022   5.  Patient will demonstrate Lt shoulder AROM WFL s symptoms for overhead reaching, lifting ability at PLOF.    Goal status: on going - assessed 08/07/2022   6.  Patient will demonstrate Lt Shoulder MMT 5/5 throughout to facilitate usual lifting/carrying at PLOF s limitation.   Goal status: on going - assessed 08/07/2022    PLAN: PT FREQUENCY: 1-2x/week  PT DURATION: 10 weeks  PLANNED INTERVENTIONS:  Therapeutic exercises, Therapeutic activity, Neuro Muscular re-education, Balance training, Gait training, Patient/Family education, Joint mobilization, Stair training, DME instructions, Dry Needling, Electrical stimulation,  Cryotherapy, Traction Moist heat, Taping, Ultrasound, Ionotophoresis 4mg /ml Dexamethasone, and Manual therapy.  All included unless contraindicated  PLAN FOR NEXT SESSION:  Continue progressive strengthening and postural support activation interventions.     Chyrel Masson, PT, DPT, OCS, ATC 08/09/22  11:37 AM

## 2022-08-13 ENCOUNTER — Ambulatory Visit (INDEPENDENT_AMBULATORY_CARE_PROVIDER_SITE_OTHER): Payer: Medicare Other | Admitting: Rehabilitative and Restorative Service Providers"

## 2022-08-13 ENCOUNTER — Encounter: Payer: Self-pay | Admitting: Rehabilitative and Restorative Service Providers"

## 2022-08-13 DIAGNOSIS — R262 Difficulty in walking, not elsewhere classified: Secondary | ICD-10-CM

## 2022-08-13 DIAGNOSIS — M5459 Other low back pain: Secondary | ICD-10-CM | POA: Diagnosis not present

## 2022-08-13 DIAGNOSIS — M6281 Muscle weakness (generalized): Secondary | ICD-10-CM | POA: Diagnosis not present

## 2022-08-13 DIAGNOSIS — M542 Cervicalgia: Secondary | ICD-10-CM

## 2022-08-13 DIAGNOSIS — G8929 Other chronic pain: Secondary | ICD-10-CM

## 2022-08-13 DIAGNOSIS — M25512 Pain in left shoulder: Secondary | ICD-10-CM

## 2022-08-13 NOTE — Therapy (Signed)
OUTPATIENT PHYSICAL THERAPY TREATMENT   Patient Name: Katherine Jarvis MRN: [id number] DOB:06-09-1961, 61 y.o., female Today's Date: 08/13/2022  PCP: Solmon Ice MD  REFERRING PROVIDER: Kirtland Bouchard, PA-C  END OF SESSION:   PT End of Session - 08/13/22 0936     Visit Number 6    Number of Visits 20    Date for PT Re-Evaluation 10/03/22    Authorization Type [employer] Medicare 20% insurance    Progress Note Due on Visit 10    PT Start Time 0931    PT Stop Time 1011    PT Time Calculation (min) 40 min    Activity Tolerance Patient tolerated treatment well    Behavior During Therapy WFL for tasks assessed/performed                  Past Medical History:  Diagnosis Date   Anxiety    Arthritis    Blood transfusion without reported diagnosis    COPD (chronic obstructive pulmonary disease) (HCC)    Hypertension    Sickle cell trait (HCC)    Past Surgical History:  Procedure Laterality Date   ABDOMINAL HYSTERECTOMY     COLONOSCOPY  2007 and 2014   FOOT SURGERY     JOINT REPLACEMENT     ROTATOR CUFF REPAIR     TOTAL HIP ARTHROPLASTY     Patient Active Problem List   Diagnosis Date Noted   Acquired hallux rigidus 09/01/2019   Lumbar radiculopathy 09/01/2019   Spondylolisthesis 09/01/2019   Other atopic dermatitis 02/12/2019   COPD (chronic obstructive pulmonary disease) (HCC) 07/22/2018   Prediabetes 02/24/2018   Vitamin D deficiency 10/22/2016   Primary insomnia 10/20/2015   Moderate episode of recurrent major depressive disorder (HCC) 07/05/2015   Subclinical hyperthyroidism 07/05/2015   Bilateral carpal tunnel syndrome 05/31/2015   Primary osteoarthritis involving multiple joints 05/31/2015   Chronic low back pain without sciatica 05/02/2015   Cigarette nicotine dependence without complication 05/02/2015   Primary osteoarthritis of hip 05/02/2015   Back pain 02/01/2013   Hip pain 02/01/2013   FACIAL PAIN 11/28/2010   TOBACCO USER 11/16/2009    BRONCHITIS 07/17/2009   ANEMIA, IRON DEFICIENCY 06/01/2009   HYPERTENSION 06/01/2009    REFERRING DIAG: M25.512,G89.29 (ICD-10-CM) - Chronic left shoulder pain M54.2 (ICD-10-CM) - Neck pain  THERAPY DIAG:  Cervicalgia  Chronic left shoulder pain  Other low back pain  Muscle weakness (generalized)  Difficulty in walking, not elsewhere classified  Rationale for Evaluation and Treatment Rehabilitation  ONSET DATE: Approx. April 2023  SUBJECTIVE:  SUBJECTIVE STATEMENT: Pt indicated a rough day yesterday.  She reported she was busy getting ready for grandbaby.  Pt indicated she was cleaning.  Pt indicated back pain was up yesterday , prompting taking medicine.  Had back pain c cramps in legs.  A litle soreness.   PERTINENT HISTORY:  See medical history above.  History of shots, physical therapy in back in past, Rt THA.  PAIN:  NPRS scale: at current:  0/10 pain, sore reported Pain location: back Pain description: sore  Aggravating factors: sleeping, lifting, carrying Relieving factors: resting arm helps, medicine at times  PRECAUTIONS: None  WEIGHT BEARING RESTRICTIONS No  FALLS:  Has patient fallen in last 6 months? Yes 1   LIVING ENVIRONMENT: Lives in: House/apartment Stairs: two story with bedroom on second story (sometimes doing one step at a time)  OCCUPATION: Disability due to back  PLOF: Independent, home care/cleaning, Rt handed.  Likes to go the gym, steam room and sauna.  Used to walk but limited at times due to back pain.   Helps take care grandkids.   PATIENT GOALS Reduce pain.   OBJECTIVE:   PATIENT SURVEYS:  08/07/2022:   FOTO update: 53  07/25/2022 FOTO intake: 51   predicted:  62  COGNITION: 07/25/2022 Overall cognitive status: Within functional  limits for tasks assessed  SENSATION: 07/25/2022 WFL  POSTURE:  07/25/2022  rounded shoulders and forward head  PALPATION: 07/25/2022 Tenderness in Lt cervical paraspinals, Lt upper trap, Lt infraspinatus with trigger points noted   Spinal ROM:   No centralization/peripheralization noted c cervical or lumbar movements.  Active ROM AROM (deg) 07/25/2022 AROM 07/31/2022 AROM 08/07/2022  Cervical Flexion 54 c central, Lt neck pain 56   Cervical  Extension 58 deg c Lt cervical pain 65 c cervical pain   Right lateral flexion     Left lateral flexion     Cervical  Right rotation 84 c no complaints 85   Cervical  Left rotation 72 c Lt cervical pain 80        Lumbar flexion To mid shin, pulling in back    Lumbar extension 50% WFL c ERP lumbar, improved to 75 % c 5 reps in standing ERP still noted  75% WFL in back  Rt lumbar lateral flexion Lt sided pulling    Lt lumbar lateral flexion Lt sided back pain     (Blank rows = not tested)    UPPER EXTREMITY ROM:  ROM Right 07/25/2022 Left 07/25/2022  Shoulder flexion WFL AROM against gravity: 120 deg c pain c end range pain  AROM in supine:  155 deg c pain end range  Shoulder extension    Shoulder abduction  AROM in supine 125 c end range pain  Shoulder adduction    Shoulder extension    Shoulder internal rotation  AROM 70 deg in supine 45 deg abduction c end range tightness noted  Shoulder external rotation  AROM 80 deg in supine 45 deg abduction  Elbow flexion    Elbow extension    Wrist flexion    Wrist extension    Wrist ulnar deviation    Wrist radial deviation    Wrist pronation    Wrist supination     (Blank rows = not tested)  UPPER EXTREMITY MMT:  MMT Right 07/25/2022 Left 07/25/2022  Shoulder flexion 4+/5 4/5 c pain  Shoulder extension    Shoulder abduction 5/5 4/5  Shoulder adduction    Shoulder extension    Shoulder internal rotation  5/5 5/5  Shoulder external rotation 5/5 4/5 c pain  Middle  trapezius    Lower trapezius    Elbow flexion 5/5 5/5  Elbow extension 5/5 5/5  Wrist flexion    Wrist extension    Wrist ulnar deviation    Wrist radial deviation    Wrist pronation    Wrist supination    Grip strength     (Blank rows = not tested)  SPECIAL TESTS:  07/25/2022 No specific testing was required for cervical, lumbar today.  (-) Drop arm, painful arc for Lt shoulder today  FUNCTIONAL TESTS:  07/25/2022 18 inch chair transfer:  no UE on 1st try   TODAY'S TREATMENT:  08/13/2022: Therex: Nustep lvl 6 10 mins UE/LE Leg extension machine double leg up , single leg lowering 15 lbs 2 x 10 bilateral Machine rows 2 x 15 , 20 lbs Leg press single leg performance 2 x 15 bilateral 68 lbs  Prone opposite arm/leg lift 2-3 sec hold x 10 bilateral   08/09/2022 Therex: UBE LE only lvl 4.0 8 mins Leg extension machine double leg up , single leg lowering 15 lbs 2 x 10 bilateral Standing rows , gh ext blue band x 20 each c focus on steady core Leg press single leg performance 2 x 15 bilateral 62 lbs  Prone opposite arm/leg lift 2-3 sec hold x 15 bilateral Standing lumbar extension x 5   Supine lumbar trunk rotation 15 sec x 3 bilateral  Supine bridge 2-3 sec hold x 10      07/31/2022 Therex: UBE fwd UE/LE lvl 3.5 5 mins, reverse UE only Lvl 3.0 4 mins  Green band rows x20  Green band GH ext x20  Standing single arm ER c towel at side green 2 x 10, performed bilaterally  Supine lumbar trunk rotation 15 sec x 3 bilateral  Supine bridge 2-3 sec hold 2 x 10  Supine clam shell green band 20 x bilateral c isometric hold contralateral leg  Supine isometric hip flexion c ipsilateral UE hold against 5 sec hold x 10 bilateral  Supine figure 4 pull toward stretch 15 sec hold x 3 bilateral  Nustep Lvl 6 10 mins UE/LE   PATIENT EDUCATION:  08/07/2022 Education details: HEP update for lumbar Person educated: Patient Education method: Explanation, Demonstration, Verbal cues,  and Handouts Education comprehension: verbalized understanding, returned demonstration, and verbal cues required  HOME EXERCISE PROGRAM: Access Code: [email or web address] URL: [email or web address] Date: 08/07/2022 Prepared by: Chyrel Masson  Exercises - Supine Cervical Retraction with Towel  - 2 x daily - 7 x weekly - 1 sets - 10 reps - 5 hold - Seated Upper Trapezius Stretch (Mirrored)  - 2-3 x daily - 7 x weekly - 1 sets - 5 reps - 15 hold - Standing Scapular Retraction  - 2-3 x daily - 7 x weekly - 1 sets - 10 reps - 5 hold - Standing Shoulder Row with Anchored Resistance  - 1-2 x daily - 7 x weekly - 1-2 sets - 10-15 reps - Shoulder Extension with Resistance  - 1-2 x daily - 7 x weekly - 1-2 sets - 10-15 reps - Shoulder External Rotation and Scapular Retraction with Resistance  - 1-2 x daily - 7 x weekly - 1-2 sets - 10-15 reps - Supine Lower Trunk Rotation  - 2-3 x daily - 7 x weekly - 1 sets - 3-5 reps - 15 hold - Supine Bridge  - 1-2 x daily - 7 x weekly -  1-2 sets - 10 reps - 2 hold - Standing Lumbar Extension with Counter  - 3-5 x daily - 7 x weekly - 1 sets - 5-10 reps - Seated Multifidi Isometric  - 2-3 x daily - 7 x weekly - 1 sets - 10 reps - 5-10 hold - Prone Alternating Arm and Leg Lifts  - 1-2 x daily - 7 x weekly - 1-2 sets - 10 reps - 3 hold  ASSESSMENT:  CLINICAL IMPRESSION: Dicussed and encouraged use of HEP and adjustment to activity (rest breaks, etc) to avoid symptom worsening to severe levels to allow better modification to activity to avoid severity.  Continued progressive strengthening and stability improvements to help improve tolerance to activity.   OBJECTIVE IMPAIRMENTS decreased activity tolerance, decreased coordination, decreased endurance, decreased mobility, difficulty walking, decreased ROM, decreased strength, hypomobility, increased fascial restrictions, impaired perceived functional ability, increased muscle spasms, impaired flexibility, impaired  UE functional use, improper body mechanics, postural dysfunction, and pain.   ACTIVITY LIMITATIONS carrying, lifting, bending, standing, squatting, sleeping, stairs, transfers, dressing, reach over head, and locomotion level  PARTICIPATION LIMITATIONS: meal prep, cleaning, laundry, interpersonal relationship, shopping, and community activity  PERSONAL FACTORS  COPD, HTN, sickle cell trait, length of time since onset, multiple body parts  are also affecting patient's functional outcome.   REHAB POTENTIAL: Good  CLINICAL DECISION MAKING: Moderate/evolving  EVALUATION COMPLEXITY: Medium    GOALS: Goals reviewed with patient? Yes  Short term PT Goals (target date for Short term goals are 3 weeks 08/15/2022) Patient will demonstrate independent use of home exercise program to maintain progress from in clinic treatments. Goal status: on going - assessed 07/29/2022   Long term PT goals (target dates for all long term goals are 10 weeks  10/03/2022 )   1. Patient will demonstrate/report pain at worst less than or equal to 2/10 to facilitate minimal limitation in daily activity secondary to pain symptoms. Goal status: on going - assessed 08/07/2022   2. Patient will demonstrate independent use of home exercise program to facilitate ability to maintain/progress functional gains from skilled physical therapy services. Goal status: on going - assessed 08/07/2022   3. Patient will demonstrate FOTO outcome > or = 62 % to indicate reduced disability due to condition. Goal status: on going - assessed 10/25/2023on going - assessed 08/07/2022   4.  Patient will demonstrate cervical AROM WFL s symptoms to facilitate usual head movements for daily activity including driving, self care.   Goal status: on going - assessed 08/07/2022   5.  Patient will demonstrate Lt shoulder AROM WFL s symptoms for overhead reaching, lifting ability at PLOF.    Goal status: on going - assessed 08/07/2022   6.   Patient will demonstrate Lt Shoulder MMT 5/5 throughout to facilitate usual lifting/carrying at PLOF s limitation.   Goal status: on going - assessed 08/07/2022    PLAN: PT FREQUENCY: 1-2x/week  PT DURATION: 10 weeks  PLANNED INTERVENTIONS:  Therapeutic exercises, Therapeutic activity, Neuro Muscular re-education, Balance training, Gait training, Patient/Family education, Joint mobilization, Stair training, DME instructions, Dry Needling, Electrical stimulation, Cryotherapy, Traction Moist heat, Taping, Ultrasound, Ionotophoresis 4mg /ml Dexamethasone, and Manual therapy.  All included unless contraindicated  PLAN FOR NEXT SESSION:  Continue progressive strengthening and stability.    Chyrel Masson, PT, DPT, OCS, ATC 08/13/22  10:11 AM

## 2022-08-15 ENCOUNTER — Ambulatory Visit (INDEPENDENT_AMBULATORY_CARE_PROVIDER_SITE_OTHER): Payer: Medicare Other | Admitting: Rehabilitative and Restorative Service Providers"

## 2022-08-15 ENCOUNTER — Encounter: Payer: Self-pay | Admitting: Rehabilitative and Restorative Service Providers"

## 2022-08-15 DIAGNOSIS — M6281 Muscle weakness (generalized): Secondary | ICD-10-CM | POA: Diagnosis not present

## 2022-08-15 DIAGNOSIS — M25512 Pain in left shoulder: Secondary | ICD-10-CM

## 2022-08-15 DIAGNOSIS — M542 Cervicalgia: Secondary | ICD-10-CM | POA: Diagnosis not present

## 2022-08-15 DIAGNOSIS — G8929 Other chronic pain: Secondary | ICD-10-CM

## 2022-08-15 DIAGNOSIS — R262 Difficulty in walking, not elsewhere classified: Secondary | ICD-10-CM

## 2022-08-15 DIAGNOSIS — M5459 Other low back pain: Secondary | ICD-10-CM | POA: Diagnosis not present

## 2022-08-15 NOTE — Therapy (Signed)
OUTPATIENT PHYSICAL THERAPY TREATMENT   Patient Name: Katherine Jarvis MRN: [id number] DOB:Jul 03, 1961, 61 y.o., female Today's Date: 08/15/2022  PCP: Solmon Ice MD  REFERRING PROVIDER: Kirtland Bouchard, PA-C  END OF SESSION:   PT End of Session - 08/15/22 0938     Visit Number 7    Number of Visits 20    Date for PT Re-Evaluation 10/03/22    Authorization Type [employer] Medicare 20% insurance    Progress Note Due on Visit 10    PT Start Time 0934    PT Stop Time 1014    PT Time Calculation (min) 40 min    Activity Tolerance Patient tolerated treatment well    Behavior During Therapy WFL for tasks assessed/performed                   Past Medical History:  Diagnosis Date   Anxiety    Arthritis    Blood transfusion without reported diagnosis    COPD (chronic obstructive pulmonary disease) (HCC)    Hypertension    Sickle cell trait (HCC)    Past Surgical History:  Procedure Laterality Date   ABDOMINAL HYSTERECTOMY     COLONOSCOPY  2007 and 2014   FOOT SURGERY     JOINT REPLACEMENT     ROTATOR CUFF REPAIR     TOTAL HIP ARTHROPLASTY     Patient Active Problem List   Diagnosis Date Noted   Acquired hallux rigidus 09/01/2019   Lumbar radiculopathy 09/01/2019   Spondylolisthesis 09/01/2019   Other atopic dermatitis 02/12/2019   COPD (chronic obstructive pulmonary disease) (HCC) 07/22/2018   Prediabetes 02/24/2018   Vitamin D deficiency 10/22/2016   Primary insomnia 10/20/2015   Moderate episode of recurrent major depressive disorder (HCC) 07/05/2015   Subclinical hyperthyroidism 07/05/2015   Bilateral carpal tunnel syndrome 05/31/2015   Primary osteoarthritis involving multiple joints 05/31/2015   Chronic low back pain without sciatica 05/02/2015   Cigarette nicotine dependence without complication 05/02/2015   Primary osteoarthritis of hip 05/02/2015   Back pain 02/01/2013   Hip pain 02/01/2013   FACIAL PAIN 11/28/2010   TOBACCO USER 11/16/2009    BRONCHITIS 07/17/2009   ANEMIA, IRON DEFICIENCY 06/01/2009   HYPERTENSION 06/01/2009    REFERRING DIAG: M25.512,G89.29 (ICD-10-CM) - Chronic left shoulder pain M54.2 (ICD-10-CM) - Neck pain  THERAPY DIAG:  Cervicalgia  Chronic left shoulder pain  Other low back pain  Muscle weakness (generalized)  Difficulty in walking, not elsewhere classified  Rationale for Evaluation and Treatment Rehabilitation  ONSET DATE: Approx. April 2023  SUBJECTIVE:  SUBJECTIVE STATEMENT: Pt indicated no pain upon arrival today.  Reported taking medicine and resting yesterday.   PERTINENT HISTORY:  See medical history above.  History of shots, physical therapy in back in past, Rt THA.  PAIN:  NPRS scale: at current:  0/10  Pain location: back Pain description: sore  Aggravating factors: sleeping, lifting, carrying Relieving factors: resting arm helps, medicine at times  PRECAUTIONS: None  WEIGHT BEARING RESTRICTIONS No  FALLS:  Has patient fallen in last 6 months? Yes 1   LIVING ENVIRONMENT: Lives in: House/apartment Stairs: two story with bedroom on second story (sometimes doing one step at a time)  OCCUPATION: Disability due to back  PLOF: Independent, home care/cleaning, Rt handed.  Likes to go the gym, steam room and sauna.  Used to walk but limited at times due to back pain.   Helps take care grandkids.   PATIENT GOALS Reduce pain.   OBJECTIVE:   PATIENT SURVEYS:  08/07/2022:   FOTO update: 53  07/25/2022 FOTO intake: 51   predicted:  62  COGNITION: 07/25/2022 Overall cognitive status: Within functional limits for tasks assessed  SENSATION: 07/25/2022 WFL  POSTURE:  07/25/2022  rounded shoulders and forward head  PALPATION: 07/25/2022 Tenderness in Lt cervical  paraspinals, Lt upper trap, Lt infraspinatus with trigger points noted   Spinal ROM:   No centralization/peripheralization noted c cervical or lumbar movements.  Active ROM AROM (deg) 07/25/2022 AROM 07/31/2022 AROM 08/07/2022  Cervical Flexion 54 c central, Lt neck pain 56   Cervical  Extension 58 deg c Lt cervical pain 65 c cervical pain   Right lateral flexion     Left lateral flexion     Cervical  Right rotation 84 c no complaints 85   Cervical  Left rotation 72 c Lt cervical pain 80        Lumbar flexion To mid shin, pulling in back    Lumbar extension 50% WFL c ERP lumbar, improved to 75 % c 5 reps in standing ERP still noted  75% WFL in back  Rt lumbar lateral flexion Lt sided pulling    Lt lumbar lateral flexion Lt sided back pain     (Blank rows = not tested)    UPPER EXTREMITY ROM:  ROM Right 07/25/2022 Left 07/25/2022  Shoulder flexion WFL AROM against gravity: 120 deg c pain c end range pain  AROM in supine:  155 deg c pain end range  Shoulder extension    Shoulder abduction  AROM in supine 125 c end range pain  Shoulder adduction    Shoulder extension    Shoulder internal rotation  AROM 70 deg in supine 45 deg abduction c end range tightness noted  Shoulder external rotation  AROM 80 deg in supine 45 deg abduction  Elbow flexion    Elbow extension    Wrist flexion    Wrist extension    Wrist ulnar deviation    Wrist radial deviation    Wrist pronation    Wrist supination     (Blank rows = not tested)  UPPER EXTREMITY MMT:  MMT Right 07/25/2022 Left 07/25/2022 Right 08/15/2022 Left 08/15/2022  Shoulder flexion 4+/5 4/5 c pain 4+/5 5/5  Shoulder extension      Shoulder abduction 5/5 4/5  4/5  Shoulder adduction      Shoulder extension      Shoulder internal rotation 5/5 5/5  5/5  Shoulder external rotation 5/5 4/5 c pain  4/5  Middle trapezius  Lower trapezius      Elbow flexion 5/5 5/5    Elbow extension 5/5 5/5    Wrist flexion       Wrist extension      Wrist ulnar deviation      Wrist radial deviation      Wrist pronation      Wrist supination      Grip strength       (Blank rows = not tested)  SPECIAL TESTS:  07/25/2022 No specific testing was required for cervical, lumbar today.  (-) Drop arm, painful arc for Lt shoulder today  FUNCTIONAL TESTS:  07/25/2022 18 inch chair transfer:  no UE on 1st try   TODAY'S TREATMENT:  08/15/2022 Therex: Nustep Lvl 6 10 mins UE/LE Standing bilateral UE scapular retraction c ER green band 2 x 10  Leg extension machine double leg up , single leg lowering 15 lbs 2 x 12 bilateral Machine lat pull down x 15 20 lbs Machine rows 2 x 15 , 20 lbs Leg press single leg performance 2 x 15 bilateral 68 lbs  Standing hip hike in door 5 sec hold x 10    08/13/2022: Therex: Nustep lvl 6 10 mins UE/LE Leg extension machine double leg up , single leg lowering 15 lbs 2 x 10 bilateral Machine rows 2 x 15 , 20 lbs Leg press single leg performance 2 x 15 bilateral 68 lbs  Prone opposite arm/leg lift 2-3 sec hold x 10 bilateral   08/09/2022 Therex: UBE LE only lvl 4.0 8 mins Leg extension machine double leg up , single leg lowering 15 lbs 2 x 10 bilateral Standing rows , gh ext blue band x 20 each c focus on steady core Leg press single leg performance 2 x 15 bilateral 62 lbs  Prone opposite arm/leg lift 2-3 sec hold x 15 bilateral Standing lumbar extension x 5   Supine lumbar trunk rotation 15 sec x 3 bilateral  Supine bridge 2-3 sec hold x 10      PATIENT EDUCATION:  08/07/2022 Education details: HEP update for lumbar Person educated: Patient Education method: Explanation, Demonstration, Verbal cues, and Handouts Education comprehension: verbalized understanding, returned demonstration, and verbal cues required  HOME EXERCISE PROGRAM: Access Code: [email or web address] URL: [email or web address] Date: 08/07/2022 Prepared by: Chyrel Masson  Exercises - Supine  Cervical Retraction with Towel  - 2 x daily - 7 x weekly - 1 sets - 10 reps - 5 hold - Seated Upper Trapezius Stretch (Mirrored)  - 2-3 x daily - 7 x weekly - 1 sets - 5 reps - 15 hold - Standing Scapular Retraction  - 2-3 x daily - 7 x weekly - 1 sets - 10 reps - 5 hold - Standing Shoulder Row with Anchored Resistance  - 1-2 x daily - 7 x weekly - 1-2 sets - 10-15 reps - Shoulder Extension with Resistance  - 1-2 x daily - 7 x weekly - 1-2 sets - 10-15 reps - Shoulder External Rotation and Scapular Retraction with Resistance  - 1-2 x daily - 7 x weekly - 1-2 sets - 10-15 reps - Supine Lower Trunk Rotation  - 2-3 x daily - 7 x weekly - 1 sets - 3-5 reps - 15 hold - Supine Bridge  - 1-2 x daily - 7 x weekly - 1-2 sets - 10 reps - 2 hold - Standing Lumbar Extension with Counter  - 3-5 x daily - 7 x weekly - 1 sets - 5-10 reps - Seated  Multifidi Isometric  - 2-3 x daily - 7 x weekly - 1 sets - 10 reps - 5-10 hold - Prone Alternating Arm and Leg Lifts  - 1-2 x daily - 7 x weekly - 1-2 sets - 10 reps - 3 hold  ASSESSMENT:  CLINICAL IMPRESSION: Pt indicated reduced symptoms today after relative rest yesterday.  Recheck of Lt shoulder strength showed some improvement but some deficits as noted still present.  Continued skilled PT services for strengthening improvements and overall tolerance improvements for daily activity warranted at this time.   OBJECTIVE IMPAIRMENTS decreased activity tolerance, decreased coordination, decreased endurance, decreased mobility, difficulty walking, decreased ROM, decreased strength, hypomobility, increased fascial restrictions, impaired perceived functional ability, increased muscle spasms, impaired flexibility, impaired UE functional use, improper body mechanics, postural dysfunction, and pain.   ACTIVITY LIMITATIONS carrying, lifting, bending, standing, squatting, sleeping, stairs, transfers, dressing, reach over head, and locomotion level  PARTICIPATION LIMITATIONS:  meal prep, cleaning, laundry, interpersonal relationship, shopping, and community activity  PERSONAL FACTORS  COPD, HTN, sickle cell trait, length of time since onset, multiple body parts  are also affecting patient's functional outcome.   REHAB POTENTIAL: Good  CLINICAL DECISION MAKING: Moderate/evolving  EVALUATION COMPLEXITY: Medium    GOALS: Goals reviewed with patient? Yes  Short term PT Goals (target date for Short term goals are 3 weeks 08/15/2022) Patient will demonstrate independent use of home exercise program to maintain progress from in clinic treatments. Goal status: on going - assessed 07/29/2022   Long term PT goals (target dates for all long term goals are 10 weeks  10/03/2022 )   1. Patient will demonstrate/report pain at worst less than or equal to 2/10 to facilitate minimal limitation in daily activity secondary to pain symptoms. Goal status: on going - assessed 08/07/2022   2. Patient will demonstrate independent use of home exercise program to facilitate ability to maintain/progress functional gains from skilled physical therapy services. Goal status: on going - assessed 08/07/2022   3. Patient will demonstrate FOTO outcome > or = 62 % to indicate reduced disability due to condition. Goal status: on going - assessed 10/25/2023on going - assessed 08/07/2022   4.  Patient will demonstrate cervical AROM WFL s symptoms to facilitate usual head movements for daily activity including driving, self care.   Goal status: on going - assessed 08/07/2022   5.  Patient will demonstrate Lt shoulder AROM WFL s symptoms for overhead reaching, lifting ability at PLOF.    Goal status: on going - assessed 08/07/2022   6.  Patient will demonstrate Lt Shoulder MMT 5/5 throughout to facilitate usual lifting/carrying at PLOF s limitation.   Goal status: on going - assessed 08/07/2022    PLAN: PT FREQUENCY: 1-2x/week  PT DURATION: 10 weeks  PLANNED INTERVENTIONS:   Therapeutic exercises, Therapeutic activity, Neuro Muscular re-education, Balance training, Gait training, Patient/Family education, Joint mobilization, Stair training, DME instructions, Dry Needling, Electrical stimulation, Cryotherapy, Traction Moist heat, Taping, Ultrasound, Ionotophoresis 4mg /ml Dexamethasone, and Manual therapy.  All included unless contraindicated  PLAN FOR NEXT SESSION:  Continue progressive strengthening and stability ( shoulder and hip strengthening)   Chyrel Masson, PT, DPT, OCS, ATC 08/15/22  10:12 AM

## 2022-08-20 ENCOUNTER — Ambulatory Visit (INDEPENDENT_AMBULATORY_CARE_PROVIDER_SITE_OTHER): Payer: Medicare Other | Admitting: Rehabilitative and Restorative Service Providers"

## 2022-08-20 ENCOUNTER — Encounter: Payer: Self-pay | Admitting: Rehabilitative and Restorative Service Providers"

## 2022-08-20 DIAGNOSIS — G8929 Other chronic pain: Secondary | ICD-10-CM

## 2022-08-20 DIAGNOSIS — M6281 Muscle weakness (generalized): Secondary | ICD-10-CM

## 2022-08-20 DIAGNOSIS — M542 Cervicalgia: Secondary | ICD-10-CM | POA: Diagnosis not present

## 2022-08-20 DIAGNOSIS — M25512 Pain in left shoulder: Secondary | ICD-10-CM

## 2022-08-20 DIAGNOSIS — M5459 Other low back pain: Secondary | ICD-10-CM

## 2022-08-20 DIAGNOSIS — R262 Difficulty in walking, not elsewhere classified: Secondary | ICD-10-CM

## 2022-08-20 NOTE — Therapy (Signed)
OUTPATIENT PHYSICAL THERAPY TREATMENT   Patient Name: Katherine Jarvis MRN: [id number] DOB:Jul 01, 1961, 61 y.o., female Today's Date: 08/20/2022  PCP: Solmon Ice MD  REFERRING PROVIDER: Kirtland Bouchard, PA-C  END OF SESSION:   PT End of Session - 08/20/22 1030     Visit Number 8    Number of Visits 20    Date for PT Re-Evaluation 10/03/22    Authorization Type [employer] Medicare 20% insurance    Progress Note Due on Visit 10    PT Start Time 1031    PT Stop Time 1056    PT Time Calculation (min) 25 min    Activity Tolerance Patient tolerated treatment well    Behavior During Therapy WFL for tasks assessed/performed                    Past Medical History:  Diagnosis Date   Anxiety    Arthritis    Blood transfusion without reported diagnosis    COPD (chronic obstructive pulmonary disease) (HCC)    Hypertension    Sickle cell trait (HCC)    Past Surgical History:  Procedure Laterality Date   ABDOMINAL HYSTERECTOMY     COLONOSCOPY  2007 and 2014   FOOT SURGERY     JOINT REPLACEMENT     ROTATOR CUFF REPAIR     TOTAL HIP ARTHROPLASTY     Patient Active Problem List   Diagnosis Date Noted   Acquired hallux rigidus 09/01/2019   Lumbar radiculopathy 09/01/2019   Spondylolisthesis 09/01/2019   Other atopic dermatitis 02/12/2019   COPD (chronic obstructive pulmonary disease) (HCC) 07/22/2018   Prediabetes 02/24/2018   Vitamin D deficiency 10/22/2016   Primary insomnia 10/20/2015   Moderate episode of recurrent major depressive disorder (HCC) 07/05/2015   Subclinical hyperthyroidism 07/05/2015   Bilateral carpal tunnel syndrome 05/31/2015   Primary osteoarthritis involving multiple joints 05/31/2015   Chronic low back pain without sciatica 05/02/2015   Cigarette nicotine dependence without complication 05/02/2015   Primary osteoarthritis of hip 05/02/2015   Back pain 02/01/2013   Hip pain 02/01/2013   FACIAL PAIN 11/28/2010   TOBACCO USER 11/16/2009    BRONCHITIS 07/17/2009   ANEMIA, IRON DEFICIENCY 06/01/2009   HYPERTENSION 06/01/2009    REFERRING DIAG: M25.512,G89.29 (ICD-10-CM) - Chronic left shoulder pain M54.2 (ICD-10-CM) - Neck pain  THERAPY DIAG:  Cervicalgia  Chronic left shoulder pain  Other low back pain  Muscle weakness (generalized)  Difficulty in walking, not elsewhere classified  Rationale for Evaluation and Treatment Rehabilitation  ONSET DATE: Approx. April 2023  SUBJECTIVE:  SUBJECTIVE STATEMENT: Pt indicated feeling some complaints in Rt shoulder today but overall the back and neck were feeling good today.   Pt indicated feeling some soreness after last visit with shoulder strengthening.   PERTINENT HISTORY:  See medical history above.  History of shots, physical therapy in back in past, Rt THA.  PAIN:  NPRS scale: at current:  0/10  Pain location: back Pain description: sore  Aggravating factors: sleeping, lifting, carrying Relieving factors: resting arm helps, medicine at times  PRECAUTIONS: None  WEIGHT BEARING RESTRICTIONS No  FALLS:  Has patient fallen in last 6 months? Yes 1   LIVING ENVIRONMENT: Lives in: House/apartment Stairs: two story with bedroom on second story (sometimes doing one step at a time)  OCCUPATION: Disability due to back  PLOF: Independent, home care/cleaning, Rt handed.  Likes to go the gym, steam room and sauna.  Used to walk but limited at times due to back pain.   Helps take care grandkids.   PATIENT GOALS Reduce pain.   OBJECTIVE:   PATIENT SURVEYS:  08/07/2022:   FOTO update: 53  07/25/2022 FOTO intake: 51   predicted:  62  COGNITION: 07/25/2022 Overall cognitive status: Within functional limits for tasks  assessed  SENSATION: 07/25/2022 WFL  POSTURE:  07/25/2022  rounded shoulders and forward head  PALPATION: 07/25/2022 Tenderness in Lt cervical paraspinals, Lt upper trap, Lt infraspinatus with trigger points noted   Spinal ROM:   No centralization/peripheralization noted c cervical or lumbar movements.  Active ROM AROM (deg) 07/25/2022 AROM 07/31/2022 AROM 08/07/2022  Cervical Flexion 54 c central, Lt neck pain 56   Cervical  Extension 58 deg c Lt cervical pain 65 c cervical pain   Right lateral flexion     Left lateral flexion     Cervical  Right rotation 84 c no complaints 85   Cervical  Left rotation 72 c Lt cervical pain 80        Lumbar flexion To mid shin, pulling in back    Lumbar extension 50% WFL c ERP lumbar, improved to 75 % c 5 reps in standing ERP still noted  75% WFL in back  Rt lumbar lateral flexion Lt sided pulling    Lt lumbar lateral flexion Lt sided back pain     (Blank rows = not tested)    UPPER EXTREMITY ROM:  ROM Right 07/25/2022 Left 07/25/2022  Shoulder flexion WFL AROM against gravity: 120 deg c pain c end range pain  AROM in supine:  155 deg c pain end range  Shoulder extension    Shoulder abduction  AROM in supine 125 c end range pain  Shoulder adduction    Shoulder extension    Shoulder internal rotation  AROM 70 deg in supine 45 deg abduction c end range tightness noted  Shoulder external rotation  AROM 80 deg in supine 45 deg abduction  Elbow flexion    Elbow extension    Wrist flexion    Wrist extension    Wrist ulnar deviation    Wrist radial deviation    Wrist pronation    Wrist supination     (Blank rows = not tested)  UPPER EXTREMITY MMT:  MMT Right 07/25/2022 Left 07/25/2022 Right 08/15/2022 Left 08/15/2022  Shoulder flexion 4+/5 4/5 c pain 4+/5 5/5  Shoulder extension      Shoulder abduction 5/5 4/5  4/5  Shoulder adduction      Shoulder extension      Shoulder internal rotation 5/5 5/5  5/5  Shoulder  external rotation 5/5 4/5 c pain  4/5  Middle trapezius      Lower trapezius      Elbow flexion 5/5 5/5    Elbow extension 5/5 5/5    Wrist flexion      Wrist extension      Wrist ulnar deviation      Wrist radial deviation      Wrist pronation      Wrist supination      Grip strength       (Blank rows = not tested)  SPECIAL TESTS:  07/25/2022 No specific testing was required for cervical, lumbar today.  (-) Drop arm, painful arc for Lt shoulder today  FUNCTIONAL TESTS:  07/25/2022 18 inch chair transfer:  no UE on 1st try   TODAY'S TREATMENT:  08/20/2022 Therex: Nustep Lvl 6 10 mins UE/LE Leg extension machine double leg up , single leg lowering 15 lbs 2 x 15 bilateral Leg press single leg performance 2 x 15 bilateral 68 lbs  Standing hip hike in door 5 sec hold x 10   08/15/2022 Therex: Nustep Lvl 6 10 mins UE/LE Standing bilateral UE scapular retraction c ER green band 2 x 10  Leg extension machine double leg up , single leg lowering 15 lbs 2 x 12 bilateral Machine lat pull down x 15 20 lbs Machine rows 2 x 15 , 20 lbs Leg press single leg performance 2 x 15 bilateral 68 lbs  Standing hip hike in door 5 sec hold x 10    08/13/2022: Therex: Nustep lvl 6 10 mins UE/LE Leg extension machine double leg up , single leg lowering 15 lbs 2 x 10 bilateral Machine rows 2 x 15 , 20 lbs Leg press single leg performance 2 x 15 bilateral 68 lbs  Prone opposite arm/leg lift 2-3 sec hold x 10 bilateral  PATIENT EDUCATION:  08/07/2022 Education details: HEP update for lumbar Person educated: Patient Education method: Explanation, Demonstration, Verbal cues, and Handouts Education comprehension: verbalized understanding, returned demonstration, and verbal cues required  HOME EXERCISE PROGRAM: Access Code: [email or web address] URL: [email or web address] Date: 08/07/2022 Prepared by: Chyrel Masson  Exercises - Supine Cervical Retraction with Towel  - 2 x daily - 7 x  weekly - 1 sets - 10 reps - 5 hold - Seated Upper Trapezius Stretch (Mirrored)  - 2-3 x daily - 7 x weekly - 1 sets - 5 reps - 15 hold - Standing Scapular Retraction  - 2-3 x daily - 7 x weekly - 1 sets - 10 reps - 5 hold - Standing Shoulder Row with Anchored Resistance  - 1-2 x daily - 7 x weekly - 1-2 sets - 10-15 reps - Shoulder Extension with Resistance  - 1-2 x daily - 7 x weekly - 1-2 sets - 10-15 reps - Shoulder External Rotation and Scapular Retraction with Resistance  - 1-2 x daily - 7 x weekly - 1-2 sets - 10-15 reps - Supine Lower Trunk Rotation  - 2-3 x daily - 7 x weekly - 1 sets - 3-5 reps - 15 hold - Supine Bridge  - 1-2 x daily - 7 x weekly - 1-2 sets - 10 reps - 2 hold - Standing Lumbar Extension with Counter  - 3-5 x daily - 7 x weekly - 1 sets - 5-10 reps - Seated Multifidi Isometric  - 2-3 x daily - 7 x weekly - 1 sets - 10 reps - 5-10 hold - Prone Alternating Arm and  Leg Lifts  - 1-2 x daily - 7 x weekly - 1-2 sets - 10 reps - 3 hold  ASSESSMENT:  CLINICAL IMPRESSION: Avoid shoulder strengthening today due to complaints indicated upon arrival.  Late arrival for appointment shortened treatment time.  Continued focus on hip/LE strengthening while in clinic today.  Overall good tolerance.    OBJECTIVE IMPAIRMENTS decreased activity tolerance, decreased coordination, decreased endurance, decreased mobility, difficulty walking, decreased ROM, decreased strength, hypomobility, increased fascial restrictions, impaired perceived functional ability, increased muscle spasms, impaired flexibility, impaired UE functional use, improper body mechanics, postural dysfunction, and pain.   ACTIVITY LIMITATIONS carrying, lifting, bending, standing, squatting, sleeping, stairs, transfers, dressing, reach over head, and locomotion level  PARTICIPATION LIMITATIONS: meal prep, cleaning, laundry, interpersonal relationship, shopping, and community activity  PERSONAL FACTORS  COPD, HTN, sickle  cell trait, length of time since onset, multiple body parts  are also affecting patient's functional outcome.   REHAB POTENTIAL: Good  CLINICAL DECISION MAKING: Moderate/evolving  EVALUATION COMPLEXITY: Medium    GOALS: Goals reviewed with patient? Yes  Short term PT Goals (target date for Short term goals are 3 weeks 08/15/2022) Patient will demonstrate independent use of home exercise program to maintain progress from in clinic treatments. Goal status: on going - assessed 07/29/2022   Long term PT goals (target dates for all long term goals are 10 weeks  10/03/2022 )   1. Patient will demonstrate/report pain at worst less than or equal to 2/10 to facilitate minimal limitation in daily activity secondary to pain symptoms. Goal status: on going - assessed 08/07/2022   2. Patient will demonstrate independent use of home exercise program to facilitate ability to maintain/progress functional gains from skilled physical therapy services. Goal status: on going - assessed 08/07/2022   3. Patient will demonstrate FOTO outcome > or = 62 % to indicate reduced disability due to condition. Goal status: on going - assessed 10/25/2023on going - assessed 08/07/2022   4.  Patient will demonstrate cervical AROM WFL s symptoms to facilitate usual head movements for daily activity including driving, self care.   Goal status: on going - assessed 08/07/2022   5.  Patient will demonstrate Lt shoulder AROM WFL s symptoms for overhead reaching, lifting ability at PLOF.    Goal status: on going - assessed 08/07/2022   6.  Patient will demonstrate Lt Shoulder MMT 5/5 throughout to facilitate usual lifting/carrying at PLOF s limitation.   Goal status: on going - assessed 08/07/2022    PLAN: PT FREQUENCY: 1-2x/week  PT DURATION: 10 weeks  PLANNED INTERVENTIONS:  Therapeutic exercises, Therapeutic activity, Neuro Muscular re-education, Balance training, Gait training, Patient/Family education, Joint  mobilization, Stair training, DME instructions, Dry Needling, Electrical stimulation, Cryotherapy, Traction Moist heat, Taping, Ultrasound, Ionotophoresis 4mg /ml Dexamethasone, and Manual therapy.  All included unless contraindicated  PLAN FOR NEXT SESSION:  Strengthening for UE/LE as tolerated.  Recheck ROM neck/back.    Chyrel Masson, PT, DPT, OCS, ATC 08/20/22  10:52 AM

## 2022-08-22 ENCOUNTER — Encounter: Payer: Self-pay | Admitting: Rehabilitative and Restorative Service Providers"

## 2022-08-22 ENCOUNTER — Ambulatory Visit (INDEPENDENT_AMBULATORY_CARE_PROVIDER_SITE_OTHER): Payer: Medicare Other | Admitting: Rehabilitative and Restorative Service Providers"

## 2022-08-22 DIAGNOSIS — M5459 Other low back pain: Secondary | ICD-10-CM | POA: Diagnosis not present

## 2022-08-22 DIAGNOSIS — M25512 Pain in left shoulder: Secondary | ICD-10-CM | POA: Diagnosis not present

## 2022-08-22 DIAGNOSIS — M542 Cervicalgia: Secondary | ICD-10-CM | POA: Diagnosis not present

## 2022-08-22 DIAGNOSIS — R262 Difficulty in walking, not elsewhere classified: Secondary | ICD-10-CM

## 2022-08-22 DIAGNOSIS — G8929 Other chronic pain: Secondary | ICD-10-CM

## 2022-08-22 DIAGNOSIS — M6281 Muscle weakness (generalized): Secondary | ICD-10-CM

## 2022-08-22 NOTE — Therapy (Addendum)
OUTPATIENT PHYSICAL THERAPY TREATMENT Katherine Jarvis note /DISCHARGE   Patient Name: Katherine Jarvis MRN: [id number] DOB:[birth year], 61 y.o., female Today's Date: 08/22/2022  PCP: Solmon Ice MD  REFERRING PROVIDER: Kirtland Bouchard, PA-C  Progress Note Reporting Period 07/25/2022 to 08/22/2022  See note below for Objective Data and Assessment of Progress/Goals.      END OF SESSION:   PT End of Session - 08/22/22 1036     Visit Number 9    Number of Visits 20    Date for PT Re-Evaluation 10/03/22    Authorization Type [employer] Medicare 20% insurance    Progress Note Due on Visit 10    PT Start Time 1023    PT Stop Time 1102    PT Time Calculation (min) 39 min    Activity Tolerance Patient tolerated treatment well    Behavior During Therapy WFL for tasks assessed/performed                     Past Medical History:  Diagnosis Date   Anxiety    Arthritis    Blood transfusion without reported diagnosis    COPD (chronic obstructive pulmonary disease) (HCC)    Hypertension    Sickle cell trait (HCC)    Past Surgical History:  Procedure Laterality Date   ABDOMINAL HYSTERECTOMY     COLONOSCOPY  2007 and 2014   FOOT SURGERY     JOINT REPLACEMENT     ROTATOR CUFF REPAIR     TOTAL HIP ARTHROPLASTY     Patient Active Problem List   Diagnosis Date Noted   Acquired hallux rigidus 09/01/2019   Lumbar radiculopathy 09/01/2019   Spondylolisthesis 09/01/2019   Other atopic dermatitis 02/12/2019   COPD (chronic obstructive pulmonary disease) (HCC) 07/22/2018   Prediabetes 02/24/2018   Vitamin D deficiency 10/22/2016   Primary insomnia 10/20/2015   Moderate episode of recurrent major depressive disorder (HCC) 07/05/2015   Subclinical hyperthyroidism 07/05/2015   Bilateral carpal tunnel syndrome 05/31/2015   Primary osteoarthritis involving multiple joints 05/31/2015   Chronic low back pain without sciatica 05/02/2015   Cigarette nicotine dependence without  complication 05/02/2015   Primary osteoarthritis of hip 05/02/2015   Back pain 02/01/2013   Hip pain 02/01/2013   FACIAL PAIN 11/28/2010   TOBACCO USER 11/16/2009   BRONCHITIS 07/17/2009   ANEMIA, IRON DEFICIENCY 06/01/2009   HYPERTENSION 06/01/2009    REFERRING DIAG: M25.512,G89.29 (ICD-10-CM) - Chronic left shoulder pain M54.2 (ICD-10-CM) - Neck pain  THERAPY DIAG:  Cervicalgia  Chronic left shoulder pain  Other low back pain  Muscle weakness (generalized)  Difficulty in walking, not elsewhere classified  Rationale for Evaluation and Treatment Rehabilitation  ONSET DATE: Approx. April 2023  SUBJECTIVE:  SUBJECTIVE STATEMENT: Pt indicated global rating of change +7 a very great deal better.  Pt indicated neck pain good.  Pt indicated back pain has been good.  Still taking pills for symptoms.   PERTINENT HISTORY:  See medical history above.  History of shots, physical therapy in back in past, Rt THA.  PAIN:  NPRS scale: at current:  0/10  Pain location: back Pain description: sore  Aggravating factors: sleeping, lifting, carrying Relieving factors: resting arm helps, medicine at times  PRECAUTIONS: None  WEIGHT BEARING RESTRICTIONS No  FALLS:  Has patient fallen in last 6 months? Yes 1   LIVING ENVIRONMENT: Lives in: House/apartment Stairs: two story with bedroom on second story (sometimes doing one step at a time)  OCCUPATION: Disability due to back  PLOF: Independent, home care/cleaning, Rt handed.  Likes to go the gym, steam room and sauna.  Used to walk but limited at times due to back pain.   Helps take care grandkids.   PATIENT GOALS Reduce pain.   OBJECTIVE:   PATIENT SURVEYS:  08/22/2022:  FOTO 48  08/07/2022:   FOTO update: 53  07/25/2022 FOTO  intake: 51   predicted:  62  COGNITION: 07/25/2022 Overall cognitive status: Within functional limits for tasks assessed  SENSATION: 07/25/2022 WFL  POSTURE:  07/25/2022  rounded shoulders and forward head  PALPATION: 07/25/2022 Tenderness in Lt cervical paraspinals, Lt upper trap, Lt infraspinatus with trigger points noted   Spinal ROM:   No centralization/peripheralization noted c cervical or lumbar movements.  Active ROM AROM (deg) 07/25/2022 AROM 07/31/2022 AROM 08/07/2022 AROM 08/22/2022  Cervical Flexion 54 c central, Lt neck pain 56  70  Cervical  Extension 58 deg c Lt cervical pain 65 c cervical pain  68  Right lateral flexion      Left lateral flexion      Cervical  Right rotation 84 c no complaints 85  86  Cervical  Left rotation 72 c Lt cervical pain 80  86        Lumbar flexion To mid shin, pulling in back     Lumbar extension 50% WFL c ERP lumbar, improved to 75 % c 5 reps in standing ERP still noted  75% WFL in back 100 % WFL  Rt lumbar lateral flexion Lt sided pulling     Lt lumbar lateral flexion Lt sided back pain      (Blank rows = not tested)    UPPER EXTREMITY ROM:  ROM Right 07/25/2022 Left 07/25/2022 Left  08/22/2022  Shoulder flexion WFL AROM against gravity: 120 deg c pain c end range pain  AROM in supine:  155 deg c pain end range [facility]  Shoulder extension     Shoulder abduction  AROM in supine 125 c end range pain WFL  Shoulder adduction     Shoulder extension     Shoulder internal rotation  AROM 70 deg in supine 45 deg abduction c end range tightness noted [facility]  Shoulder external rotation  AROM 80 deg in supine 45 deg abduction [facility]  Elbow flexion     Elbow extension     Wrist flexion     Wrist extension     Wrist ulnar deviation     Wrist radial deviation     Wrist pronation     Wrist supination      (Blank rows = not tested)  UPPER EXTREMITY MMT:  MMT Right 07/25/2022 Left 07/25/2022 Right 08/15/2022 Left 08/15/2022   Shoulder flexion 4+/5  4/5 c pain 4+/5 5/5  Shoulder extension      Shoulder abduction 5/5 4/5  4/5  Shoulder adduction      Shoulder extension      Shoulder internal rotation 5/5 5/5  5/5  Shoulder external rotation 5/5 4/5 c pain  4/5  Middle trapezius      Lower trapezius      Elbow flexion 5/5 5/5    Elbow extension 5/5 5/5    Wrist flexion      Wrist extension      Wrist ulnar deviation      Wrist radial deviation      Wrist pronation      Wrist supination      Grip strength       (Blank rows = not tested)  SPECIAL TESTS:  07/25/2022 No specific testing was required for cervical, lumbar today.  (-) Drop arm, painful arc for Lt shoulder today  FUNCTIONAL TESTS:  07/25/2022 18 inch chair transfer:  no UE on 1st try   TODAY'S TREATMENT:  08/22/2022 Therex: Nustep Lvl 6 10 mins UE/LE  Review of existing HEP for trial HEP period.  Visual review of handout on website c verbal cues for consistency and frequency of use.  Standing lumbar extension x5 AROM  Leg extension machine double leg up , single leg lowering 15 lbs 2 x 15 bilateral Leg press single leg performance 2 x 15 bilateral 68 lbs     08/20/2022 Therex: Nustep Lvl 6 10 mins UE/LE Leg extension machine double leg up , single leg lowering 15 lbs 2 x 15 bilateral Leg press single leg performance 2 x 15 bilateral 68 lbs  Standing hip hike in door 5 sec hold x 10   08/15/2022 Therex: Nustep Lvl 6 10 mins UE/LE Standing bilateral UE scapular retraction c ER green band 2 x 10  Leg extension machine double leg up , single leg lowering 15 lbs 2 x 12 bilateral Machine lat pull down x 15 20 lbs Machine rows 2 x 15 , 20 lbs Leg press single leg performance 2 x 15 bilateral 68 lbs  Standing hip hike in door 5 sec hold x 10    PATIENT EDUCATION:  08/22/2022 Education details: HEP review for trial HEP Person educated: Patient Education method: Programmer, multimedia, Facilities manager, Verbal cues, and Handouts Education  comprehension: verbalized understanding, returned demonstration, and verbal cues required  HOME EXERCISE PROGRAM: Access Code: [email or web address] URL: [email or web address] Date: 08/22/2022 Prepared by: Chyrel Masson  Exercises - Supine Cervical Retraction with Towel  - 2 x daily - 7 x weekly - 1 sets - 10 reps - 5 hold - Seated Upper Trapezius Stretch (Mirrored)  - 2-3 x daily - 7 x weekly - 1 sets - 5 reps - 15 hold - Standing Scapular Retraction  - 2-3 x daily - 7 x weekly - 1 sets - 10 reps - 5 hold - Standing Shoulder Row with Anchored Resistance  - 1-2 x daily - 7 x weekly - 1-2 sets - 10-15 reps - Shoulder Extension with Resistance  - 1-2 x daily - 7 x weekly - 1-2 sets - 10-15 reps - Shoulder External Rotation and Scapular Retraction with Resistance  - 1-2 x daily - 7 x weekly - 1-2 sets - 10-15 reps - Supine Lower Trunk Rotation  - 2-3 x daily - 7 x weekly - 1 sets - 3-5 reps - 15 hold - Supine Bridge  - 1-2 x daily - 7 x weekly -  1-2 sets - 10 reps - 2 hold - Standing Lumbar Extension with Counter  - 3-5 x daily - 7 x weekly - 1 sets - 5-10 reps - Seated Multifidi Isometric  - 2-3 x daily - 7 x weekly - 1 sets - 10 reps - 5-10 hold - Prone Alternating Arm and Leg Lifts  - 1-2 x daily - 7 x weekly - 1-2 sets - 10 reps - 3 hold - Standing Hip Hiking  - 1-2 x daily - 7 x weekly - 1 sets - 10 reps - 5 hold  ASSESSMENT:  CLINICAL IMPRESSION: Pt has attended 9 visits overall, reporting GROC +7 at this time.  See objective data for updated information.  Due to overall improvements as noted, progression to trial HEP indicated at this time.  Pt was in agreement with plan at this time.  Return to clinic in future based off symptom presentation.  After 30 days, referral needed.   OBJECTIVE IMPAIRMENTS decreased activity tolerance, decreased coordination, decreased endurance, decreased mobility, difficulty walking, decreased ROM, decreased strength, hypomobility, increased fascial  restrictions, impaired perceived functional ability, increased muscle spasms, impaired flexibility, impaired UE functional use, improper body mechanics, postural dysfunction, and pain.   ACTIVITY LIMITATIONS carrying, lifting, bending, standing, squatting, sleeping, stairs, transfers, dressing, reach over head, and locomotion level  PARTICIPATION LIMITATIONS: meal prep, cleaning, laundry, interpersonal relationship, shopping, and community activity  PERSONAL FACTORS  COPD, HTN, sickle cell trait, length of time since onset, multiple body parts  are also affecting patient's functional outcome.   REHAB POTENTIAL: Good  CLINICAL DECISION MAKING: Moderate/evolving  EVALUATION COMPLEXITY: Medium    GOALS: Goals reviewed with patient? Yes  Short term PT Goals (target date for Short term goals are 3 weeks 08/15/2022) Patient will demonstrate independent use of home exercise program to maintain progress from in clinic treatments. Goal status: Met   Long term PT goals (target dates for all long term goals are 10 weeks  10/03/2022 )   1. Patient will demonstrate/report pain at worst less than or equal to 2/10 to facilitate minimal limitation in daily activity secondary to pain symptoms. Goal status: partially met 08/22/2022   2. Patient will demonstrate independent use of home exercise program to facilitate ability to maintain/progress functional gains from skilled physical therapy services. Goal status: Met 08/22/2022   3. Patient will demonstrate FOTO outcome > or = 62 % to indicate reduced disability due to condition. Goal status:  not met 08/22/2022   4.  Patient will demonstrate cervical AROM WFL s symptoms to facilitate usual head movements for daily activity including driving, self care.   Goal status: Met 08/22/2022   5.  Patient will demonstrate Lt shoulder AROM WFL s symptoms for overhead reaching, lifting ability at PLOF.    Goal status: Met 08/22/2022   6.  Patient will  demonstrate Lt Shoulder MMT 5/5 throughout to facilitate usual lifting/carrying at PLOF s limitation.   Goal status: on going - assessed 08/07/2022    PLAN: PT FREQUENCY: 1-2x/week  PT DURATION: 10 weeks  PLANNED INTERVENTIONS:  Therapeutic exercises, Therapeutic activity, Neuro Muscular re-education, Balance training, Gait training, Patient/Family education, Joint mobilization, Stair training, DME instructions, Dry Needling, Electrical stimulation, Cryotherapy, Traction Moist heat, Taping, Ultrasound, Ionotophoresis 4mg /ml Dexamethasone, and Manual therapy.  All included unless contraindicated  PLAN FOR NEXT SESSION:  Trial HEP.  Discharge after 30 days inactivity.    Chyrel Masson, PT, DPT, OCS, ATC 08/22/22  10:59 AM   PHYSICAL THERAPY DISCHARGE SUMMARY  Visits from Start of Care: 9  Current functional level related to goals / functional outcomes: See note   Remaining deficits: See note   Education / Equipment: HEP  Patient goals were  mostly met . Patient is being discharged due to not returning since the last visit. Chyrel Masson, PT, DPT, OCS, ATC 10/10/22  11:24 AM

## 2022-10-17 ENCOUNTER — Ambulatory Visit (INDEPENDENT_AMBULATORY_CARE_PROVIDER_SITE_OTHER): Payer: Medicare Other | Admitting: Physician Assistant

## 2022-10-17 DIAGNOSIS — M722 Plantar fascial fibromatosis: Secondary | ICD-10-CM | POA: Diagnosis not present

## 2022-10-17 MED ORDER — METHYLPREDNISOLONE 4 MG PO TABS: ORAL_TABLET | ORAL | 0 refills | Status: DC

## 2022-10-17 NOTE — Progress Notes (Signed)
Office Visit Note   Patient: Katherine Jarvis           Date of Birth: 27-Apr-1961           MRN: [id number] Visit Date: 10/17/2022              Requested by: Solmon Ice, MD [address] [place],  [place] [address] PCP: Solmon Ice, MD   Assessment & Plan: Visit Diagnoses:  1. Plantar fasciitis of left foot     Plan: Discussed with her the avoidance of open back shoes.  She is to change her shoes out daily.  Gastrocsoleus stretching exercises discussed.  Will place her on a Medrol Dosepak.  She can apply Voltaren gel to the heel 4 g 4 times daily.  Blue dot heel inserts with given.  She will follow-up with [place] in 4 weeks for pain persist or becomes worse otherwise follow-up as needed  Follow-Up Instructions: No follow-ups on file.   Orders:  No orders of the defined types were placed in this encounter.  Meds ordered this encounter  Medications   methylPREDNISolone (MEDROL) 4 MG tablet    Sig: Take as directed    Dispense:  21 tablet    Refill:  0      Procedures: No procedures performed   Clinical Data: No additional findings.   Subjective: Chief Complaint  Patient presents with   Left Foot - Pain    HPI Katherine Jarvis comes in today with a complaint of left foot pain.  Pains been ongoing since November.  No injury.  States pain present when weightbearing.  She does have pain whenever she first begins to ambulate in the morning.  Rates her pain to be 10 out of 10 pain at worst.  No pain in the right heel.  States his pain in the morning causes her to actually have to limp.  She has tried a cam walker boot without any real relief.  Otherwise taking Tylenol.  Review of Systems  Constitutional:  Negative for chills and fever.     Objective: Vital Signs: LMP 09/17/1999   Physical Exam Constitutional:      Appearance: She is normal weight. She is not ill-appearing or diaphoretic.  Cardiovascular:     Pulses: Normal pulses.  Neurological:      Mental Status: She is alert and oriented to person, place, and time.  Psychiatric:        Mood and Affect: Mood normal.     Ortho Exam Bilateral feet: No rashes skin lesions ulcerations. 5/5  strength of inversion eversion against resistance.  Nontender over the posterior tibial tendon and peroneal tendons bilaterally.  Left foot slight tenderness at the insertion of the Achilles otherwise (tact bilaterally.  Left foot maximal tenderness over the medial tubercle of the calcaneus.  No tenderness over the right heel.  Tight gastrocs bilaterally.  Full dorsiflexion plantarflexion bilateral ankles without pain. Specialty Comments:  No specialty comments available.  Imaging: No results found.   PMFS History: Patient Active Problem List   Diagnosis Date Noted   Acquired hallux rigidus 09/01/2019   Lumbar radiculopathy 09/01/2019   Spondylolisthesis 09/01/2019   Other atopic dermatitis 02/12/2019   COPD (chronic obstructive pulmonary disease) (HCC) 07/22/2018   Prediabetes 02/24/2018   Vitamin D deficiency 10/22/2016   Primary insomnia 10/20/2015   Moderate episode of recurrent major depressive disorder (HCC) 07/05/2015   Subclinical hyperthyroidism 07/05/2015   Bilateral carpal tunnel syndrome 05/31/2015  Primary osteoarthritis involving multiple joints 05/31/2015   Chronic low back pain without sciatica 05/02/2015   Cigarette nicotine dependence without complication 05/02/2015   Primary osteoarthritis of hip 05/02/2015   Back pain 02/01/2013   Hip pain 02/01/2013   FACIAL PAIN 11/28/2010   TOBACCO USER 11/16/2009   BRONCHITIS 07/17/2009   ANEMIA, IRON DEFICIENCY 06/01/2009   HYPERTENSION 06/01/2009   Past Medical History:  Diagnosis Date   Anxiety    Arthritis    Blood transfusion without reported diagnosis    COPD (chronic obstructive pulmonary disease) (HCC)    Hypertension    Sickle cell trait (HCC)     Family History  Problem Relation Age of Onset   Cancer Mother     Colon cancer Mother 14   Colon polyps Neg Hx    Esophageal cancer Neg Hx    Rectal cancer Neg Hx    Stomach cancer Neg Hx     Past Surgical History:  Procedure Laterality Date   ABDOMINAL HYSTERECTOMY     COLONOSCOPY  2007 and 2014   FOOT SURGERY     JOINT REPLACEMENT     ROTATOR CUFF REPAIR     TOTAL HIP ARTHROPLASTY     Social History   Occupational History   Not on file  Tobacco Use   Smoking status: Every Day    Packs/day: 0.50    Types: Cigarettes   Smokeless tobacco: Never  Vaping Use   Vaping Use: Never used  Substance and Sexual Activity   Alcohol use: Yes    Comment: occasionally-wine   Drug use: Not Currently    Comment: not for many years   Sexual activity: Not on file

## 2022-10-31 ENCOUNTER — Ambulatory Visit (INDEPENDENT_AMBULATORY_CARE_PROVIDER_SITE_OTHER): Payer: 59 | Admitting: Podiatry

## 2022-10-31 ENCOUNTER — Ambulatory Visit (INDEPENDENT_AMBULATORY_CARE_PROVIDER_SITE_OTHER): Payer: 59

## 2022-10-31 DIAGNOSIS — M7752 Other enthesopathy of left foot: Secondary | ICD-10-CM

## 2022-10-31 DIAGNOSIS — M7750 Other enthesopathy of unspecified foot: Secondary | ICD-10-CM

## 2022-10-31 DIAGNOSIS — M722 Plantar fascial fibromatosis: Secondary | ICD-10-CM | POA: Diagnosis not present

## 2022-10-31 MED ORDER — MELOXICAM 15 MG PO TABS: 15.0000 mg | ORAL_TABLET | Freq: Every day | ORAL | 3 refills | Status: DC

## 2022-10-31 NOTE — Patient Instructions (Signed)

## 2022-11-04 NOTE — Progress Notes (Signed)
Subjective:  Patient ID: Katherine Jarvis, female    DOB: 1961-09-03,  MRN: [id number]  Chief Complaint  Patient presents with   Foot Pain    (np) left foot pain    62 y.o. female presents with the above complaint. History confirmed with patient.  Its worst in the morning when she gets out of bed  Objective:  Physical Exam: warm, good capillary refill, no trophic changes or ulcerative lesions, normal DP and PT pulses, and normal sensory exam. Left Foot: point tenderness over the heel pad   Radiographs: Multiple views x-ray of the left foot: no fracture, dislocation, swelling or degenerative changes noted and plantar calcaneal spur Assessment:   1. Plantar fasciitis, left      Plan:  Patient was evaluated and treated and all questions answered.  Discussed the etiology and treatment options for plantar fasciitis including stretching, formal physical therapy, supportive shoegears such as a running shoe or sneaker, pre fabricated orthoses, injection therapy, and oral medications. We also discussed the role of surgical treatment of this for patients who do not improve after exhausting non-surgical treatment options.   -XR reviewed with patient -Educated patient on stretching and icing of the affected limb -Injection delivered to the plantar fascia of the left foot. -Rx for meloxicam. Educated on use, risks and benefits of the medication -Referral sent to physical therapy at [facility] in [place]  After sterile prep with povidone-iodine solution and alcohol, the left heel was injected with 0.5cc 2% xylocaine plain, 0.5cc 0.5% marcaine plain, 5mg  triamcinolone acetonide, and 2mg  dexamethasone was injected along the medial plantar fascia at the insertion on the plantar calcaneus. The patient tolerated the procedure well without complication.   Return if symptoms worsen or fail to improve.

## 2023-01-01 ENCOUNTER — Other Ambulatory Visit: Payer: Self-pay | Admitting: Podiatry

## 2023-01-22 ENCOUNTER — Ambulatory Visit (HOSPITAL_COMMUNITY)
Admission: EM | Admit: 2023-01-22 | Discharge: 2023-01-22 | Disposition: A | Discharge status: Home / Self Care | Payer: 59 | Attending: Family Medicine | Admitting: Family Medicine

## 2023-01-22 ENCOUNTER — Encounter (HOSPITAL_COMMUNITY): Payer: Self-pay

## 2023-01-22 DIAGNOSIS — R21 Rash and other nonspecific skin eruption: Secondary | ICD-10-CM

## 2023-01-22 MED ORDER — TRIAMCINOLONE ACETONIDE 0.1 % EX CREA: 1.0000 | TOPICAL_CREAM | Freq: Two times a day (BID) | CUTANEOUS | 0 refills | Status: AC

## 2023-01-22 MED ORDER — CEPHALEXIN 250 MG PO CAPS: 250.0000 mg | ORAL_CAPSULE | Freq: Three times a day (TID) | ORAL | 0 refills | Status: AC

## 2023-01-22 MED ORDER — PREDNISONE 20 MG PO TABS: 40.0000 mg | ORAL_TABLET | Freq: Every day | ORAL | 0 refills | Status: AC

## 2023-01-22 NOTE — ED Triage Notes (Signed)
Patient reports that she was bitten by a bug 11 days ago. Patient states she had a pimple like area and states it is now red, burning and itching.  Patient states she has been putting Neosporin on the area, but has not gotten any better.

## 2023-01-22 NOTE — ED Provider Notes (Signed)
[facility]    CSN: [id number] Arrival date & time: 01/22/23  1728      History   Chief Complaint Chief Complaint  Patient presents with   Insect Bite    HPI Katherine Jarvis is a 62 y.o. female.   HPI Here with an red itchy area on her right upper chest.  It has been there about a week.  She is uncertain if anything better.  She has been putting Neosporin on it and is not improving.  No fever or chills.  No trouble breathing.  No history of diabetes  Past Medical History:  Diagnosis Date   Anxiety    Arthritis    Blood transfusion without reported diagnosis    COPD (chronic obstructive pulmonary disease)    Hypertension    Sickle cell trait     Patient Active Problem List   Diagnosis Date Noted   Acquired hallux rigidus 09/01/2019   Lumbar radiculopathy 09/01/2019   Spondylolisthesis 09/01/2019   Other atopic dermatitis 02/12/2019   COPD (chronic obstructive pulmonary disease) 07/22/2018   Prediabetes 02/24/2018   Vitamin D deficiency 10/22/2016   Primary insomnia 10/20/2015   Moderate episode of recurrent major depressive disorder 07/05/2015   Subclinical hyperthyroidism 07/05/2015   Bilateral carpal tunnel syndrome 05/31/2015   Primary osteoarthritis involving multiple joints 05/31/2015   Chronic low back pain without sciatica 05/02/2015   Cigarette nicotine dependence without complication 05/02/2015   Primary osteoarthritis of hip 05/02/2015   Back pain 02/01/2013   Hip pain 02/01/2013   FACIAL PAIN 11/28/2010   TOBACCO USER 11/16/2009   BRONCHITIS 07/17/2009   ANEMIA, IRON DEFICIENCY 06/01/2009   HYPERTENSION 06/01/2009    Past Surgical History:  Procedure Laterality Date   ABDOMINAL HYSTERECTOMY     COLONOSCOPY  2007 and 2014   FOOT SURGERY     JOINT REPLACEMENT     ROTATOR CUFF REPAIR     TOTAL HIP ARTHROPLASTY      OB History   No obstetric history on file.      Home Medications    Prior to Admission medications    Medication Sig Start Date End Date Taking? Authorizing Provider  cephALEXin (KEFLEX) 250 MG capsule Take 1 capsule (250 mg total) by mouth 3 (three) times daily for 5 days. 01/22/23 01/27/23 Yes [place]aclara Acklin, Janace Aris, MD  predniSONE (DELTASONE) 20 MG tablet Take 2 tablets (40 mg total) by mouth daily with breakfast for 3 days. 01/22/23 01/25/23 Yes Yahye Siebert, Janace Aris, MD  triamcinolone cream (KENALOG) 0.1 % Apply 1 Application topically 2 (two) times daily. To affected area till better 01/22/23  Yes Iyanna Drummer, Janace Aris, MD  ANORO ELLIPTA 62.5-25 MCG/INH AEPB Inhale 1 puff into the lungs 2 (two) times daily. 09/22/19   [provider]  cyclobenzaprine (FLEXERIL) 10 MG tablet Take 1 tablet (10 mg total) by mouth 2 (two) times daily as needed for muscle spasms. 06/28/22   Bing Neighbors, NP  gabapentin (NEURONTIN) 300 MG capsule Take 300 mg by mouth 3 (three) times daily.    [provider]  gabapentin (NEURONTIN) 300 MG capsule Take 1 capsule by mouth at bedtime. 04/22/22   [provider]  HYDROcodone-acetaminophen (NORCO/VICODIN) 5-325 MG tablet Take 1-2 tablets by mouth every 6 (six) hours as needed. 06/10/22   Ellsworth Lennox, PA-C  losartan-hydrochlorothiazide (HYZAAR) 50-12.5 MG per tablet Take 1 tablet by mouth daily. 02/01/13   Gordy Savers, MD  meloxicam (MOBIC) 15 MG tablet TAKE 1 TABLET(15 MG)  BY MOUTH DAILY 01/01/23   Edwin CapMcDonald, Adam R, DPM  mupirocin cream (BACTROBAN) 2 % Apply 1 application topically 2 (two) times daily. Patient not taking: Reported on 09/21/2019 05/08/16   Tharon AquasPatrick, Frank C, PA  traMADol (ULTRAM) 50 MG tablet Take 50 mg by mouth every 6 (six) hours as needed for moderate pain.    [provider]    Family History Family History  Problem Relation Age of Onset   Cancer Mother    Colon cancer Mother [id number]   Colon polyps Neg Hx    Esophageal cancer Neg Hx    Rectal cancer Neg Hx    Stomach cancer Neg Hx     Social History Social History    Tobacco Use   Smoking status: Every Day    Packs/day: .5    Types: Cigarettes   Smokeless tobacco: Never  Vaping Use   Vaping Use: Never used  Substance Use Topics   Alcohol use: Yes    Comment: occasionally-wine   Drug use: Not Currently    Comment: not for many years     Allergies   Lisinopril   Review of Systems Review of Systems   Physical Exam Triage Vital Signs ED Triage Vitals [01/22/23 1827]  Enc Vitals Group     BP 122/84     Pulse Rate 62     Resp 16     Temp 97.8 F (36.6 C)     Temp Source Oral     SpO2 95 %     Weight      Height      Head Circumference      Peak Flow      Pain Score      Pain Loc      Pain Edu?      Excl. in GC?    No data found.  Updated Vital Signs BP 122/84 (BP Location: Left Arm)   Pulse 62   Temp 97.8 F (36.6 C) (Oral)   Resp 16   LMP 09/17/1999   SpO2 95%   Visual Acuity Right Eye Distance:   Left Eye Distance:   Bilateral Distance:    Right Eye Near:   Left Eye Near:    Bilateral Near:     Physical Exam Vitals reviewed.  Constitutional:      General: She is not in acute distress.    Appearance: She is not ill-appearing, toxic-appearing or diaphoretic.  Cardiovascular:     Rate and Rhythm: Normal rate and regular rhythm.  Pulmonary:     Effort: Pulmonary effort is normal.     Breath sounds: Normal breath sounds.  Skin:    Coloration: Skin is not pale.     Comments: On her right upper anterior chest near her clavicle there is an area of mild erythema with some satellite tiny bumps around it.  The area of erythema is about 3 x 2 cm.  There is no ulceration and there is no fluctuance.  There is minimal induration.  Neurological:     General: No focal deficit present.     Mental Status: She is alert and oriented to person, place, and time.  Psychiatric:        Behavior: Behavior normal.      UC Treatments / Results  Labs (all labs ordered are listed, but only abnormal results are  displayed) Labs Reviewed - No data to display  EKG   Radiology No results found.  Procedures Procedures (including critical care time)  Medications Ordered in UC Medications - No data to display  Initial Impression / Assessment and Plan / UC Course  I have reviewed the triage vital signs and the nursing notes.  Pertinent labs & imaging results that were available during my care of the patient were reviewed by me and considered in my medical decision making (see chart for details).        This appears to be a contact dermatitis.  3 days of prednisone orally and topical triamcinolone are sent in.  So that I do a few days of Keflex since the skin is mildly indurated in case this is a staph infection. Final Clinical Impressions(s) / UC Diagnoses   Final diagnoses:  Rash     Discharge Instructions      Put triamcinolone cream on the rash area 2 times daily until better  Take cephalexin 250 mg--1 capsule 3 times daily for 5 days  Take prednisone 20 mg--2 daily for 3 days      ED Prescriptions     Medication Sig Dispense Auth. Provider   predniSONE (DELTASONE) 20 MG tablet Take 2 tablets (40 mg total) by mouth daily with breakfast for 3 days. 6 tablet Malaiya Paczkowski, Janace Aris, MD   cephALEXin (KEFLEX) 250 MG capsule Take 1 capsule (250 mg total) by mouth 3 (three) times daily for 5 days. 15 capsule Zenia Resides, MD   triamcinolone cream (KENALOG) 0.1 % Apply 1 Application topically 2 (two) times daily. To affected area till better 80 g Marlinda Mike Janace Aris, MD      PDMP not reviewed this encounter.   Zenia Resides, MD 01/22/23 [phone]

## 2023-01-22 NOTE — Discharge Instructions (Signed)
Put triamcinolone cream on the rash area 2 times daily until better  Take cephalexin 250 mg--1 capsule 3 times daily for 5 days  Take prednisone 20 mg--2 daily for 3 days

## 2023-01-26 ENCOUNTER — Other Ambulatory Visit: Payer: Self-pay | Admitting: Podiatry

## 2023-03-28 NOTE — Therapy (Signed)
OUTPATIENT PHYSICAL THERAPY THORACOLUMBAR EVALUATION   Patient Name: Katherine Jarvis MRN: [id number] DOB:Feb 21, 1961, 62 y.o., female Today's Date: 03/31/2023  END OF SESSION:  PT End of Session - 03/31/23 1511     Visit Number 1    Number of Visits 13    Date for PT Re-Evaluation 05/12/23    Authorization Type [employer]    PT Start Time 1515   late check in   PT Stop Time 1549    PT Time Calculation (min) 34 min    Activity Tolerance Patient tolerated treatment well    Behavior During Therapy WFL for tasks assessed/performed             Past Medical History:  Diagnosis Date   Anxiety    Arthritis    Blood transfusion without reported diagnosis    COPD (chronic obstructive pulmonary disease) (HCC)    Hypertension    Sickle cell trait (HCC)    Past Surgical History:  Procedure Laterality Date   ABDOMINAL HYSTERECTOMY     COLONOSCOPY  2007 and 2014   FOOT SURGERY     JOINT REPLACEMENT     ROTATOR CUFF REPAIR     TOTAL HIP ARTHROPLASTY     Patient Active Problem List   Diagnosis Date Noted   Acquired hallux rigidus 09/01/2019   Lumbar radiculopathy 09/01/2019   Spondylolisthesis 09/01/2019   Other atopic dermatitis 02/12/2019   COPD (chronic obstructive pulmonary disease) (HCC) 07/22/2018   Prediabetes 02/24/2018   Vitamin D deficiency 10/22/2016   Primary insomnia 10/20/2015   Moderate episode of recurrent major depressive disorder (HCC) 07/05/2015   Subclinical hyperthyroidism 07/05/2015   Bilateral carpal tunnel syndrome 05/31/2015   Primary osteoarthritis involving multiple joints 05/31/2015   Chronic low back pain without sciatica 05/02/2015   Cigarette nicotine dependence without complication 05/02/2015   Primary osteoarthritis of hip 05/02/2015   Back pain 02/01/2013   Hip pain 02/01/2013   FACIAL PAIN 11/28/2010   TOBACCO USER 11/16/2009   BRONCHITIS 07/17/2009   ANEMIA, IRON DEFICIENCY 06/01/2009   HYPERTENSION 06/01/2009    PCP: Solmon Ice,  MD  REFERRING PROVIDER: Solmon Ice, MD  REFERRING DIAG: M54.50 (ICD-10-CM) - Low back pain G89.29 (ICD-10-CM) - Other chronic pain  Rationale for Evaluation and Treatment: Rehabilitation  THERAPY DIAG:  Other low back pain  Muscle weakness (generalized)  Difficulty in walking, not elsewhere classified  ONSET DATE: since 2015  SUBJECTIVE:  SUBJECTIVE STATEMENT: Pt states she began to develop back pain after THA in 2015. States she has some disc issues but is trying to avoid surgery. Gradually worsening, has difficulty with standing. Bothers her when cleaning, dishes, etc. Typically fairly active, enjoys walking (up to a mile usually). Has to take a lot of rest but is able to do most of what she needs to do. Goes to gym, does crunches and LE strengthening.  No N/T, no buckling, no bowel/bladder, no saddle anesthesia.   PERTINENT HISTORY:  PMH: anxiety, COPD, HTN, sickle cell trait, HTN  PAIN:  Are you having pain: soreness Location/description: belt line, BIL - aggravating factors: standing >36min, walking half a mile, stair navigation - Easing factors: rest, leaning forward    PRECAUTIONS: None  WEIGHT BEARING RESTRICTIONS: No  FALLS:  Has patient fallen in last 6 months? No (end of last year)  LIVING ENVIRONMENT: Townhouse, 12 steps upstairs one rail, no STE Lives w/ boyfriend   OCCUPATION: not working since 2023  PLOF: Independent  PATIENT GOALS: wants to be able to stand, avoid using AD  NEXT MD VISIT: 3 months (from eval, June 2024)  OBJECTIVE:   DIAGNOSTIC FINDINGS:  Most recent spine imaging October 2023, refer to epic for details, did show spondylolisthesis L3-4 and L4-5  PATIENT SURVEYS:  FOTO 51 current, 60 predicted  SCREENING FOR RED FLAGS: Red flag  questioning/screening reassuring    COGNITION: Overall cognitive status: Within functional limits for tasks assessed     SENSATION: Mild numbness L foot (pt states due to prior surgery), otherwise sensation intact all extremities    POSTURE: increased lumbar lordosis  PALPATION: Deferred given time constraints  LUMBAR ROM:   AROM eval  Flexion 100% (ankle joint line)  Extension 75% *   Right lateral flexion   Left lateral flexion   Right rotation 100% s*  Left rotation 75% s   (Blank rows = not tested) (Key: WFL = within functional limits not formally assessed, * = concordant pain, s = stiffness/stretching sensation, NT = not tested)   LOWER EXTREMITY ROM:     Active  Right eval Left eval  Hip flexion    Hip extension    Hip internal rotation    Hip external rotation    Knee extension    Knee flexion    (Blank rows = not tested) (Key: WFL = within functional limits not formally assessed, * = concordant pain, s = stiffness/stretching sensation, NT = not tested)  Comments:    LOWER EXTREMITY MMT:    MMT Right eval Left eval  Hip flexion 4 4  Hip abduction (modified sitting) 5 5  Hip internal rotation 4 5  Hip external rotation 3+ 5  Knee flexion 4 4+  Knee extension 5 5  Ankle dorsiflexion 5 5   (Blank rows = not tested) (Key: WFL = within functional limits not formally assessed, * = concordant pain, s = stiffness/stretching sensation, NT = not tested)  Comments:    LUMBAR SPECIAL TESTS:  Deferred today  FUNCTIONAL TESTS:  5xSTS: 8 sec standard chair no UE support   GAIT: Distance walked: within clinic Assistive device utilized: None Level of assistance: Complete Independence Comments: reduced gait speed/cadence  TODAY'S TREATMENT:  [facility] Adult PT Treatment:                                                DATE: 03/31/23 Deferred  given time constraints  PATIENT EDUCATION:  Education details: Pt education on PT impairments, prognosis, and POC. Informed consent. Rationale for interventions Person educated: Patient Education method: Explanation, Demonstration, Tactile cues, Verbal cues, and Handouts Education comprehension: verbalized understanding, returned demonstration, verbal cues required, tactile cues required, and needs further education    HOME EXERCISE PROGRAM: Deferred given time constraints  ASSESSMENT:  CLINICAL IMPRESSION: Pt is a very pleasant 62 year old woman who arrives to PT evaluation on this date for low back pain. Pt reports difficulty with daily activities, standing, and walking due to pain. During today's session pt demonstrates limitations in lumbar mobility and LE strength (R more affected than L) which are likely contributing to difficulty with aforementioned activities. Recommend skilled PT to address aforementioned deficits to improve functional independence/tolerance. HEP deferred given time constraints with late check in. No adverse events, pt tolerates exam well. Pt departs today's session in no acute distress, all voiced questions/concerns addressed appropriately from PT perspective.    OBJECTIVE IMPAIRMENTS: decreased activity tolerance, decreased endurance, decreased mobility, difficulty walking, decreased ROM, decreased strength, improper body mechanics, postural dysfunction, and pain.   ACTIVITY LIMITATIONS: carrying, lifting, bending, standing, stairs, transfers, and locomotion level  PARTICIPATION LIMITATIONS: meal prep, cleaning, laundry, and community activity  PERSONAL FACTORS: Age, Time since onset of injury/illness/exacerbation, and 3+ comorbidities: anxiety, COPD, HTN, sickle cell trait  are also affecting patient's functional outcome.   REHAB POTENTIAL: Fair given chronicity  CLINICAL DECISION MAKING: Stable/uncomplicated  EVALUATION COMPLEXITY: Low   GOALS: Goals  reviewed with patient? No given time constraints  SHORT TERM GOALS: Target date: 04/21/2023    Pt will demonstrate appropriate understanding and performance of initially prescribed HEP in order to facilitate improved independence with management of symptoms.  Baseline: HEP provided on eval Goal status: INITIAL   2. Pt will score greater than or equal to 56 on FOTO in order to demonstrate improved perception of function due to symptoms.  Baseline: 51  Goal status: INITIAL     LONG TERM GOALS: Target date: 05/12/2023   Pt will score 60 on FOTO in order to demonstrate improved perception of functional status due to symptoms.  Baseline: 51 Goal status: INITIAL  2.  Pt will demonstrate symmetrical and painless lumbar rotation AROM in order to demonstrate improved tolerance to functional movement patterns.  Baseline: see ROM chart above Goal status: INITIAL  3.  Pt will demonstrate grossly symmetrical hip/knee MMT in order to demonstrate improved strength for functional movements.  Baseline: see MMT chart above Goal status: INITIAL  4. Pt will report/demonstrate ability to walk a mile with less than 2 pt increase in pain on NPS in order to facilitate improved community navigation and overall health/QOL.  Baseline: pain w/ half a mile  Goal status: INITIAL  5. Pt will report/demonstrate ability to tolerate standing for up to with less than 2 pt increase in pain on NPS in order to facilitate improved tolerance to household tasks.  Baseline: standing tolerance <30min   Goal status: INITIAL  6. Pt will be able to navigate 12 stairs with unilat rail with less than 2 pt increase in pain on NPS in order to  facilitate improved independence w/ navigation in home.   Baseline: reports difficulty w/ stairs due to low back and COPD  Goal status: INITIAL   PLAN:  PT FREQUENCY: 2x/week  PT DURATION: 6 weeks  PLANNED INTERVENTIONS: Therapeutic exercises, Therapeutic activity, Neuromuscular  re-education, Balance training, Gait training, Patient/Family education, Self Care, Joint mobilization, Stair training, Aquatic Therapy, Dry Needling, Electrical stimulation, Spinal mobilization, Cryotherapy, Moist heat, Taping, Manual therapy, and Re-evaluation.  PLAN FOR NEXT SESSION: establish HEP. Emphasis on postural endurance, hip/LE strengthening. Symptom modification strategies as indicated/appropriate.   Ashley Murrain PT, DPT 03/31/2023 4:59 PM

## 2023-03-31 ENCOUNTER — Ambulatory Visit: Payer: 59 | Attending: Family Medicine | Admitting: Physical Therapy

## 2023-03-31 ENCOUNTER — Encounter: Payer: Self-pay | Admitting: Physical Therapy

## 2023-03-31 ENCOUNTER — Other Ambulatory Visit: Payer: Self-pay

## 2023-03-31 DIAGNOSIS — M6281 Muscle weakness (generalized): Secondary | ICD-10-CM | POA: Diagnosis present

## 2023-03-31 DIAGNOSIS — R262 Difficulty in walking, not elsewhere classified: Secondary | ICD-10-CM | POA: Diagnosis present

## 2023-03-31 DIAGNOSIS — M5459 Other low back pain: Secondary | ICD-10-CM | POA: Diagnosis present

## 2023-04-01 NOTE — Therapy (Signed)
OUTPATIENT PHYSICAL THERAPY TREATMENT NOTE    Patient Name: Katherine Jarvis MRN: [id number] DOB:04-27-1961, 62 y.o., female Today's Date: 04/02/2023  END OF SESSION:  PT End of Session - 04/02/23 1100     Visit Number 2    Number of Visits 13    Date for PT Re-Evaluation 05/12/23    Authorization Type [employer]    Progress Note Due on Visit 10    PT Start Time 1100    PT Stop Time 1140    PT Time Calculation (min) 40 min    Activity Tolerance Patient tolerated treatment well    Behavior During Therapy WFL for tasks assessed/performed              Past Medical History:  Diagnosis Date   Anxiety    Arthritis    Blood transfusion without reported diagnosis    COPD (chronic obstructive pulmonary disease) (HCC)    Hypertension    Sickle cell trait (HCC)    Past Surgical History:  Procedure Laterality Date   ABDOMINAL HYSTERECTOMY     COLONOSCOPY  2007 and 2014   FOOT SURGERY     JOINT REPLACEMENT     ROTATOR CUFF REPAIR     TOTAL HIP ARTHROPLASTY     Patient Active Problem List   Diagnosis Date Noted   Acquired hallux rigidus 09/01/2019   Lumbar radiculopathy 09/01/2019   Spondylolisthesis 09/01/2019   Other atopic dermatitis 02/12/2019   COPD (chronic obstructive pulmonary disease) (HCC) 07/22/2018   Prediabetes 02/24/2018   Vitamin D deficiency 10/22/2016   Primary insomnia 10/20/2015   Moderate episode of recurrent major depressive disorder (HCC) 07/05/2015   Subclinical hyperthyroidism 07/05/2015   Bilateral carpal tunnel syndrome 05/31/2015   Primary osteoarthritis involving multiple joints 05/31/2015   Chronic low back pain without sciatica 05/02/2015   Cigarette nicotine dependence without complication 05/02/2015   Primary osteoarthritis of hip 05/02/2015   Back pain 02/01/2013   Hip pain 02/01/2013   FACIAL PAIN 11/28/2010   TOBACCO USER 11/16/2009   BRONCHITIS 07/17/2009   ANEMIA, IRON DEFICIENCY 06/01/2009   HYPERTENSION 06/01/2009    PCP:  Solmon Ice, MD  REFERRING PROVIDER: Solmon Ice, MD  REFERRING DIAG: M54.50 (ICD-10-CM) - Low back pain G89.29 (ICD-10-CM) - Other chronic pain  Rationale for Evaluation and Treatment: Rehabilitation  THERAPY DIAG:  Other low back pain  Muscle weakness (generalized)  Difficulty in walking, not elsewhere classified  ONSET DATE: since 2015  SUBJECTIVE:  Per eval - Pt states she began to develop back pain after THA in 2015. States she has some disc issues but is trying to avoid surgery. Gradually worsening, has difficulty with standing. Bothers her when cleaning, dishes, etc. Typically fairly active, enjoys walking (up to a mile usually). Has to take a lot of rest but is able to do most of what she needs to do. Goes to gym, does crunches and LE strengthening.  No N/T, no buckling, no bowel/bladder, no saddle anesthesia.   SUBJECTIVE STATEMENT: 04/02/2023 a little soreness today, no overt pain. No other new updates  PERTINENT HISTORY:  PMH: anxiety, COPD, HTN, sickle cell trait, HTN  PAIN:  Are you having pain: soreness, no pain  Location/description: belt line, BIL  Per eval -  - aggravating factors: standing >75min, walking half a mile, stair navigation - Easing factors: rest, leaning forward    PRECAUTIONS: None  WEIGHT BEARING RESTRICTIONS: No  FALLS:  Has patient fallen in last 6 months? No (end of last year)  LIVING ENVIRONMENT: Townhouse, 12 steps upstairs one rail, no STE Lives w/ boyfriend   OCCUPATION: not working since 2023  PLOF: Independent  PATIENT GOALS: wants to be able to stand, avoid using AD  NEXT MD VISIT: 3 months (from eval, June 2024)  OBJECTIVE: (objective measures completed at initial evaluation unless otherwise dated)   DIAGNOSTIC FINDINGS:  Most  recent spine imaging October 2023, refer to epic for details, did show spondylolisthesis L3-4 and L4-5  PATIENT SURVEYS:  FOTO 51 current, 60 predicted  SCREENING FOR RED FLAGS: Red flag questioning/screening reassuring    COGNITION: Overall cognitive status: Within functional limits for tasks assessed     SENSATION: Mild numbness L foot (pt states due to prior surgery), otherwise sensation intact all extremities    POSTURE: increased lumbar lordosis  PALPATION: Deferred given time constraints  LUMBAR ROM:   AROM eval  Flexion 100% (ankle joint line)  Extension 75% *   Right lateral flexion   Left lateral flexion   Right rotation 100% s*  Left rotation 75% s   (Blank rows = not tested) (Key: WFL = within functional limits not formally assessed, * = concordant pain, s = stiffness/stretching sensation, NT = not tested)   LOWER EXTREMITY ROM:     Active  Right eval Left eval  Hip flexion    Hip extension    Hip internal rotation    Hip external rotation    Knee extension    Knee flexion    (Blank rows = not tested) (Key: WFL = within functional limits not formally assessed, * = concordant pain, s = stiffness/stretching sensation, NT = not tested)  Comments:    LOWER EXTREMITY MMT:    MMT Right eval Left eval  Hip flexion 4 4  Hip abduction (modified sitting) 5 5  Hip internal rotation 4 5  Hip external rotation 3+ 5  Knee flexion 4 4+  Knee extension 5 5  Ankle dorsiflexion 5 5   (Blank rows = not tested) (Key: WFL = within functional limits not formally assessed, * = concordant pain, s = stiffness/stretching sensation, NT = not tested)  Comments:    LUMBAR SPECIAL TESTS:  Deferred today  FUNCTIONAL TESTS:  5xSTS: 8 sec standard chair no UE support   GAIT: Distance walked: within clinic Assistive device utilized: None Level of assistance: Complete Independence Comments: reduced gait speed/cadence  TODAY'S TREATMENT:  [facility] Adult PT Treatment:                                                DATE: 04/02/23 Therapeutic Exercise: Seated ER/IR RTB 2x8 each cues for setup and HEP Hooklying march RTB 2x8 BIL LE cues for setup and comfortable ROM, breath control  Posterior pelvic tilts x12 cues for breath control  Hooklying adduction 2x12 cues for pacing  STS BW lowest mat, 2x8 cues for pacing HEP handout + education   PATIENT EDUCATION:  Education details: rationale for interventions, HEP, relevant anatomy/physiology  Person educated: Patient Education method: Explanation, Demonstration, Tactile cues, Verbal cues, and Handouts Education comprehension: verbalized understanding, returned demonstration, verbal cues required, tactile cues required, and needs further education    HOME EXERCISE PROGRAM: Access Code: [email or web address] URL: [email or web address] Date: 04/02/2023 Prepared by: Fransisco Hertz  Exercises - Seated Hip Internal Rotation with Newman Pies and Resistance  - 2-3 x daily - 7 x weekly - 1 sets - 8 reps - Supine Posterior Pelvic Tilt  - 2-3 x daily - 7 x weekly - 1 sets - 10 reps - Sit to Stand with Armchair  - 2-3 x daily - 7 x weekly - 1 sets - 8 reps  ASSESSMENT:  CLINICAL IMPRESSION: 04/02/2023 Pt arrives w/o pain, endorses some soreness. Today working on establishing HEP with emphasis on rotational hip strength and core activation which pt tolerates well. Pt endorses some muscular fatigue and "working out" sensation but no pain. Cues as above, pt reports improved soreness/stiffness on departure. HEP as above w/ education on appropriate setup/performance. Recommend continuing along current POC in order to address relevant deficits and improve functional tolerance. Pt departs today's session in no acute distress, all voiced questions/concerns addressed appropriately from PT perspective.     Per eval  - Pt is a very pleasant 62 year old woman who arrives to PT evaluation on this date for low back pain. Pt reports difficulty with daily activities, standing, and walking due to pain. During today's session pt demonstrates limitations in lumbar mobility and LE strength (R more affected than L) which are likely contributing to difficulty with aforementioned activities. Recommend skilled PT to address aforementioned deficits to improve functional independence/tolerance. HEP deferred given time constraints with late check in. No adverse events, pt tolerates exam well. Pt departs today's session in no acute distress, all voiced questions/concerns addressed appropriately from PT perspective.    OBJECTIVE IMPAIRMENTS: decreased activity tolerance, decreased endurance, decreased mobility, difficulty walking, decreased ROM, decreased strength, improper body mechanics, postural dysfunction, and pain.   ACTIVITY LIMITATIONS: carrying, lifting, bending, standing, stairs, transfers, and locomotion level  PARTICIPATION LIMITATIONS: meal prep, cleaning, laundry, and community activity  PERSONAL FACTORS: Age, Time since onset of injury/illness/exacerbation, and 3+ comorbidities: anxiety, COPD, HTN, sickle cell trait  are also affecting patient's functional outcome.   REHAB POTENTIAL: Fair given chronicity  CLINICAL DECISION MAKING: Stable/uncomplicated  EVALUATION COMPLEXITY: Low   GOALS: Goals reviewed with patient? No given time constraints  SHORT TERM GOALS: Target date: 04/21/2023    Pt will demonstrate appropriate understanding and performance of initially prescribed HEP in order to facilitate improved independence with management of symptoms.  Baseline: HEP provided on eval Goal status: INITIAL   2. Pt will score greater than or equal to 56 on FOTO in order to demonstrate improved perception of function due to  symptoms.  Baseline: 51  Goal status: INITIAL     LONG TERM GOALS: Target date:  05/12/2023   Pt will score 60 on FOTO in order to demonstrate improved perception of functional status due to symptoms.  Baseline: 51 Goal status: INITIAL  2.  Pt will demonstrate symmetrical and painless lumbar rotation AROM in order to demonstrate improved tolerance to functional movement patterns.  Baseline: see ROM chart above Goal status: INITIAL  3.  Pt will demonstrate grossly symmetrical hip/knee MMT in order to demonstrate improved strength for functional movements.  Baseline: see MMT chart above Goal status: INITIAL  4. Pt will report/demonstrate ability to walk a mile with less than 2 pt increase in pain on NPS in order to facilitate improved community navigation and overall health/QOL.  Baseline: pain w/ half a mile  Goal status: INITIAL  5. Pt will report/demonstrate ability to tolerate standing for up to with less than 2 pt increase in pain on NPS in order to facilitate improved tolerance to household tasks.  Baseline: standing tolerance <20min   Goal status: INITIAL  6. Pt will be able to navigate 12 stairs with unilat rail with less than 2 pt increase in pain on NPS in order to facilitate improved independence w/ navigation in home.   Baseline: reports difficulty w/ stairs due to low back and COPD  Goal status: INITIAL   PLAN:  PT FREQUENCY: 2x/week  PT DURATION: 6 weeks  PLANNED INTERVENTIONS: Therapeutic exercises, Therapeutic activity, Neuromuscular re-education, Balance training, Gait training, Patient/Family education, Self Care, Joint mobilization, Stair training, Aquatic Therapy, Dry Needling, Electrical stimulation, Spinal mobilization, Cryotherapy, Moist heat, Taping, Manual therapy, and Re-evaluation.  PLAN FOR NEXT SESSION: review/update HEP PRN. Emphasis on postural endurance, hip/LE strengthening. Symptom modification strategies as indicated/appropriate.     Ashley Murrain PT, DPT 04/02/2023 11:42 AM

## 2023-04-02 ENCOUNTER — Encounter: Payer: Self-pay | Admitting: Physical Therapy

## 2023-04-02 ENCOUNTER — Ambulatory Visit: Payer: 59 | Admitting: Physical Therapy

## 2023-04-02 DIAGNOSIS — M5459 Other low back pain: Secondary | ICD-10-CM | POA: Diagnosis not present

## 2023-04-02 DIAGNOSIS — R262 Difficulty in walking, not elsewhere classified: Secondary | ICD-10-CM

## 2023-04-02 DIAGNOSIS — M6281 Muscle weakness (generalized): Secondary | ICD-10-CM

## 2023-04-18 ENCOUNTER — Telehealth: Payer: Self-pay | Admitting: Physical Therapy

## 2023-04-21 ENCOUNTER — Ambulatory Visit: Payer: 59 | Admitting: Physical Therapy

## 2023-04-23 ENCOUNTER — Ambulatory Visit: Payer: 59 | Attending: Family Medicine | Admitting: Physical Therapy

## 2023-04-23 ENCOUNTER — Encounter: Payer: Self-pay | Admitting: Physical Therapy

## 2023-04-23 DIAGNOSIS — R262 Difficulty in walking, not elsewhere classified: Secondary | ICD-10-CM | POA: Insufficient documentation

## 2023-04-23 DIAGNOSIS — M5459 Other low back pain: Secondary | ICD-10-CM | POA: Insufficient documentation

## 2023-04-23 DIAGNOSIS — M6281 Muscle weakness (generalized): Secondary | ICD-10-CM | POA: Insufficient documentation

## 2023-04-23 NOTE — Therapy (Signed)
OUTPATIENT PHYSICAL THERAPY TREATMENT NOTE    Patient Name: Katherine Jarvis MRN: [id number] DOB:1960-11-20, 62 y.o., female Today's Date: 04/23/2023  END OF SESSION:  PT End of Session - 04/23/23 1017     Visit Number 3    Number of Visits 13    Date for PT Re-Evaluation 05/12/23    Authorization Type [employer]    Progress Note Due on Visit 10    PT Start Time 1018    PT Stop Time 1057    PT Time Calculation (min) 39 min              Past Medical History:  Diagnosis Date   Anxiety    Arthritis    Blood transfusion without reported diagnosis    COPD (chronic obstructive pulmonary disease) (HCC)    Hypertension    Sickle cell trait (HCC)    Past Surgical History:  Procedure Laterality Date   ABDOMINAL HYSTERECTOMY     COLONOSCOPY  2007 and 2014   FOOT SURGERY     JOINT REPLACEMENT     ROTATOR CUFF REPAIR     TOTAL HIP ARTHROPLASTY     Patient Active Problem List   Diagnosis Date Noted   Acquired hallux rigidus 09/01/2019   Lumbar radiculopathy 09/01/2019   Spondylolisthesis 09/01/2019   Other atopic dermatitis 02/12/2019   COPD (chronic obstructive pulmonary disease) (HCC) 07/22/2018   Prediabetes 02/24/2018   Vitamin D deficiency 10/22/2016   Primary insomnia 10/20/2015   Moderate episode of recurrent major depressive disorder (HCC) 07/05/2015   Subclinical hyperthyroidism 07/05/2015   Bilateral carpal tunnel syndrome 05/31/2015   Primary osteoarthritis involving multiple joints 05/31/2015   Chronic low back pain without sciatica 05/02/2015   Cigarette nicotine dependence without complication 05/02/2015   Primary osteoarthritis of hip 05/02/2015   Back pain 02/01/2013   Hip pain 02/01/2013   FACIAL PAIN 11/28/2010   TOBACCO USER 11/16/2009   BRONCHITIS 07/17/2009   ANEMIA, IRON DEFICIENCY 06/01/2009   HYPERTENSION 06/01/2009    PCP: Solmon Ice, MD  REFERRING PROVIDER: Solmon Ice, MD  REFERRING DIAG: M54.50 (ICD-10-CM) - Low back  pain G89.29 (ICD-10-CM) - Other chronic pain  Rationale for Evaluation and Treatment: Rehabilitation  THERAPY DIAG:  Other low back pain  Muscle weakness (generalized)  ONSET DATE: since 2015  SUBJECTIVE:                                                                                                                                                                                          Per eval - Pt states she began to develop back pain after THA in 2015. States  she has some disc issues but is trying to avoid surgery. Gradually worsening, has difficulty with standing. Bothers her when cleaning, dishes, etc. Typically fairly active, enjoys walking (up to a mile usually). Has to take a lot of rest but is able to do most of what she needs to do. Goes to gym, does crunches and LE strengthening.  No N/T, no buckling, no bowel/bladder, no saddle anesthesia.   SUBJECTIVE STATEMENT: 04/23/2023 I've been doing the exercises, walking a mile and going to the gym for a few machines. No back pain right now. I'm having pain with standing but it is better. I am having decreased pain during walking.    PERTINENT HISTORY:  PMH: anxiety, COPD, HTN, sickle cell trait, HTN  PAIN:  Are you having pain: soreness, no pain  Location/description: belt line, BIL  Per eval -  - aggravating factors: standing >26min, walking half a mile, stair navigation - Easing factors: rest, leaning forward    PRECAUTIONS: None  WEIGHT BEARING RESTRICTIONS: No  FALLS:  Has patient fallen in last 6 months? No (end of last year)  LIVING ENVIRONMENT: Townhouse, 12 steps upstairs one rail, no STE Lives w/ boyfriend   OCCUPATION: not working since 2023  PLOF: Independent  PATIENT GOALS: wants to be able to stand, avoid using AD  NEXT MD VISIT: 3 months (from eval, June 2024)  OBJECTIVE: (objective measures completed at initial evaluation unless otherwise dated)   DIAGNOSTIC FINDINGS:  Most recent spine imaging  October 2023, refer to epic for details, did show spondylolisthesis L3-4 and L4-5  PATIENT SURVEYS:  FOTO 51 current, 60 predicted FOTO 83% 04/23/23  SCREENING FOR RED FLAGS: Red flag questioning/screening reassuring    COGNITION: Overall cognitive status: Within functional limits for tasks assessed     SENSATION: Mild numbness L foot (pt states due to prior surgery), otherwise sensation intact all extremities    POSTURE: increased lumbar lordosis  PALPATION: Deferred given time constraints  LUMBAR ROM:   AROM eval  Flexion 100% (ankle joint line)  Extension 75% *   Right lateral flexion   Left lateral flexion   Right rotation 100% s*  Left rotation 75% s   (Blank rows = not tested) (Key: WFL = within functional limits not formally assessed, * = concordant pain, s = stiffness/stretching sensation, NT = not tested)   LOWER EXTREMITY ROM:     Active  Right eval Left eval  Hip flexion    Hip extension    Hip internal rotation    Hip external rotation    Knee extension    Knee flexion    (Blank rows = not tested) (Key: WFL = within functional limits not formally assessed, * = concordant pain, s = stiffness/stretching sensation, NT = not tested)  Comments:    LOWER EXTREMITY MMT:    MMT Right eval Left eval Right 04/23/23  Hip flexion 4 4   Hip abduction (modified sitting) 5 5   Hip internal rotation 4 5   Hip external rotation 3+ 5 4-  Knee flexion 4 4+   Knee extension 5 5   Ankle dorsiflexion 5 5    (Blank rows = not tested) (Key: WFL = within functional limits not formally assessed, * = concordant pain, s = stiffness/stretching sensation, NT = not tested)  Comments:    LUMBAR SPECIAL TESTS:  Deferred today  FUNCTIONAL TESTS:  5xSTS: 8 sec standard chair no UE support   GAIT: Distance walked: within clinic Assistive device  utilized: None Level of assistance: Complete Independence Comments: reduced gait speed/cadence  TODAY'S TREATMENT:                                                                                                                               [facility] Adult PT Treatment:                                                DATE: 04/23/23 Therapeutic Exercise: Seated IR Red band Bilat with ball btw knees 10 x 3  Seated Red clam  Supine red clam  PPT 5 sec x 10  PPT with marching  PPT to bridge  Seated Row 25# Seated Lat pull 25# HEP    [facility] Adult PT Treatment:                                                DATE: 04/02/23 Therapeutic Exercise: Seated ER/IR RTB 2x8 each cues for setup and HEP Hooklying march RTB 2x8 BIL LE cues for setup and comfortable ROM, breath control  Posterior pelvic tilts x12 cues for breath control  Hooklying adduction 2x12 cues for pacing  STS BW lowest mat, 2x8 cues for pacing HEP handout + education   PATIENT EDUCATION:  Education details: rationale for interventions, HEP, relevant anatomy/physiology  Person educated: Patient Education method: Explanation, Demonstration, Tactile cues, Verbal cues, and Handouts Education comprehension: verbalized understanding, returned demonstration, verbal cues required, tactile cues required, and needs further education    HOME EXERCISE PROGRAM: Access Code: [email or web address] URL: [email or web address] Date: 04/02/2023 Prepared by: Fransisco Hertz  Exercises - Seated Hip Internal Rotation with Newman Pies and Resistance  - 2-3 x daily - 7 x weekly - 1 sets - 8 reps - Supine Posterior Pelvic Tilt  - 2-3 x daily - 7 x weekly - 1 sets - 10 reps - Sit to Stand with Armchair  - 2-3 x daily - 7 x weekly - 1 sets - 8 reps 04/23/23 - Supine March with Posterior Pelvic Tilt  - 1 x daily - 7 x weekly - 1-2 sets - 10 reps - Hooklying Clamshell with Resistance  - 2 x daily - 7 x weekly - 2-3 sets - 10 reps - 5 hold - Supine Bridge  - 1 x daily - 7 x weekly - 2-3 sets - 10 reps  ASSESSMENT:  CLINICAL IMPRESSION: 04/23/2023 Pt arrives w/o pain, endorses  improvement in back pain with walking and standing although pain still present with prolonged standing to wash dishes. She has been going to the gym and working on [employer], walking 1 mile at track. Went over seated row, leg press and Lat pull downs for gym options. Updated  HEP to progress hip strength deficit Right > left. Will progress to closed chain and balance in future sessions. No increased pain today. FOTO score improved beyond prediction.     Per eval - Pt is a very pleasant 62 year old woman who arrives to PT evaluation on this date for low back pain. Pt reports difficulty with daily activities, standing, and walking due to pain. During today's session pt demonstrates limitations in lumbar mobility and LE strength (R more affected than L) which are likely contributing to difficulty with aforementioned activities. Recommend skilled PT to address aforementioned deficits to improve functional independence/tolerance. HEP deferred given time constraints with late check in. No adverse events, pt tolerates exam well. Pt departs today's session in no acute distress, all voiced questions/concerns addressed appropriately from PT perspective.    OBJECTIVE IMPAIRMENTS: decreased activity tolerance, decreased endurance, decreased mobility, difficulty walking, decreased ROM, decreased strength, improper body mechanics, postural dysfunction, and pain.   ACTIVITY LIMITATIONS: carrying, lifting, bending, standing, stairs, transfers, and locomotion level  PARTICIPATION LIMITATIONS: meal prep, cleaning, laundry, and community activity  PERSONAL FACTORS: Age, Time since onset of injury/illness/exacerbation, and 3+ comorbidities: anxiety, COPD, HTN, sickle cell trait  are also affecting patient's functional outcome.   REHAB POTENTIAL: Fair given chronicity  CLINICAL DECISION MAKING: Stable/uncomplicated  EVALUATION COMPLEXITY: Low   GOALS: Goals reviewed with patient? No given time constraints  SHORT TERM  GOALS: Target date: 04/21/2023    Pt will demonstrate appropriate understanding and performance of initially prescribed HEP in order to facilitate improved independence with management of symptoms.  Baseline: HEP provided on eval Goal status: ONGOING  2. Pt will score greater than or equal to 56 on FOTO in order to demonstrate improved perception of function due to symptoms.  Baseline: 51  Goal status: MET   LONG TERM GOALS: Target date: 05/12/2023   Pt will score 60 on FOTO in order to demonstrate improved perception of functional status due to symptoms.  Baseline: 51 04/23/23: 83 Goal status: MET  2.  Pt will demonstrate symmetrical and painless lumbar rotation AROM in order to demonstrate improved tolerance to functional movement patterns.  Baseline: see ROM chart above Goal status: INITIAL  3.  Pt will demonstrate grossly symmetrical hip/knee MMT in order to demonstrate improved strength for functional movements.  Baseline: see MMT chart above Goal status: INITIAL  4. Pt will report/demonstrate ability to walk a mile with less than 2 pt increase in pain on NPS in order to facilitate improved community navigation and overall health/QOL.  Baseline: pain w/ half a mile  Goal status: INITIAL  5. Pt will report/demonstrate ability to tolerate standing for up to with less than 2 pt increase in pain on NPS in order to facilitate improved tolerance to household tasks.  Baseline: standing tolerance <5min   Goal status: INITIAL  6. Pt will be able to navigate 12 stairs with unilat rail with less than 2 pt increase in pain on NPS in order to facilitate improved independence w/ navigation in home.   Baseline: reports difficulty w/ stairs due to low back and COPD  Goal status: INITIAL   PLAN:  PT FREQUENCY: 2x/week  PT DURATION: 6 weeks  PLANNED INTERVENTIONS: Therapeutic exercises, Therapeutic activity, Neuromuscular re-education, Balance training, Gait training, Patient/Family  education, Self Care, Joint mobilization, Stair training, Aquatic Therapy, Dry Needling, Electrical stimulation, Spinal mobilization, Cryotherapy, Moist heat, Taping, Manual therapy, and Re-evaluation.  PLAN FOR NEXT SESSION: review/update HEP PRN. Emphasis on postural endurance, hip/LE  strengthening. Symptom modification strategies as indicated/appropriate.  Balance    Jannette Spanner, PTA 04/23/23 11:37 AM Phone: [phone] Fax: [phone]

## 2023-04-28 ENCOUNTER — Ambulatory Visit: Payer: 59 | Admitting: Physical Therapy

## 2023-04-28 ENCOUNTER — Encounter: Payer: Self-pay | Admitting: Physical Therapy

## 2023-04-28 DIAGNOSIS — M5459 Other low back pain: Secondary | ICD-10-CM

## 2023-04-28 DIAGNOSIS — M6281 Muscle weakness (generalized): Secondary | ICD-10-CM

## 2023-04-28 NOTE — Therapy (Signed)
OUTPATIENT PHYSICAL THERAPY TREATMENT NOTE    Patient Name: Katherine Jarvis MRN: [id number] DOB:[birth year], 62 y.o., female Today's Date: 04/28/2023  END OF SESSION:  PT End of Session - 04/28/23 0931     Visit Number 4    Number of Visits 13    Date for PT Re-Evaluation 05/12/23    Authorization Type [employer]    Progress Note Due on Visit 10    PT Start Time [phone]    Activity Tolerance Patient tolerated treatment well    Behavior During Therapy [facility] for tasks assessed/performed               Past Medical History:  Diagnosis Date   Anxiety    Arthritis    Blood transfusion without reported diagnosis    COPD (chronic obstructive pulmonary disease) (HCC)    Hypertension    Sickle cell trait (HCC)    Past Surgical History:  Procedure Laterality Date   ABDOMINAL HYSTERECTOMY     COLONOSCOPY  2007 and 2014   FOOT SURGERY     JOINT REPLACEMENT     ROTATOR CUFF REPAIR     TOTAL HIP ARTHROPLASTY     Patient Active Problem List   Diagnosis Date Noted   Acquired hallux rigidus 09/01/2019   Lumbar radiculopathy 09/01/2019   Spondylolisthesis 09/01/2019   Other atopic dermatitis 02/12/2019   COPD (chronic obstructive pulmonary disease) (HCC) 07/22/2018   Prediabetes 02/24/2018   Vitamin D deficiency 10/22/2016   Primary insomnia 10/20/2015   Moderate episode of recurrent major depressive disorder (HCC) 07/05/2015   Subclinical hyperthyroidism 07/05/2015   Bilateral carpal tunnel syndrome 05/31/2015   Primary osteoarthritis involving multiple joints 05/31/2015   Chronic low back pain without sciatica 05/02/2015   Cigarette nicotine dependence without complication 05/02/2015   Primary osteoarthritis of hip 05/02/2015   Back pain 02/01/2013   Hip pain 02/01/2013   FACIAL PAIN 11/28/2010   TOBACCO USER 11/16/2009   BRONCHITIS 07/17/2009   ANEMIA, IRON DEFICIENCY 06/01/2009   HYPERTENSION 06/01/2009    PCP: Solmon Ice, MD  REFERRING PROVIDER: Solmon Ice,  MD  REFERRING DIAG: M54.50 (ICD-10-CM) - Low back pain G89.29 (ICD-10-CM) - Other chronic pain  Rationale for Evaluation and Treatment: Rehabilitation  THERAPY DIAG:  Other low back pain  Muscle weakness (generalized)  ONSET DATE: since 2015  SUBJECTIVE:                                                                                                                                                                                          Per eval - Pt states she began to develop back pain after  THA in 2015. States she has some disc issues but is trying to avoid surgery. Gradually worsening, has difficulty with standing. Bothers her when cleaning, dishes, etc. Typically fairly active, enjoys walking (up to a mile usually). Has to take a lot of rest but is able to do most of what she needs to do. Goes to gym, does crunches and LE strengthening.  No N/T, no buckling, no bowel/bladder, no saddle anesthesia.   SUBJECTIVE STATEMENT: 04/28/2023 "When I am laying down I feel a hurtful burning in my back, this was happening a little before starting physical therapy. It comes and goes while I am laying on my back."    PERTINENT HISTORY:  PMH: anxiety, COPD, HTN, sickle cell trait, HTN  PAIN:  Are you having pain: , no pain  Location/description: belt line, BIL  Per eval -  - aggravating factors: standing >63min, walking half a mile, stair navigation - Easing factors: rest, leaning forward    PRECAUTIONS: None  WEIGHT BEARING RESTRICTIONS: No  FALLS:  Has patient fallen in last 6 months? No (end of last year)  LIVING ENVIRONMENT: Townhouse, 12 steps upstairs one rail, no STE Lives w/ boyfriend   OCCUPATION: not working since 2023  PLOF: Independent  PATIENT GOALS: wants to be able to stand, avoid using AD  NEXT MD VISIT: 3 months (from eval, June 2024)  OBJECTIVE: (objective measures completed at initial evaluation unless otherwise dated)   DIAGNOSTIC FINDINGS:  Most recent  spine imaging October 2023, refer to epic for details, did show spondylolisthesis L3-4 and L4-5  PATIENT SURVEYS:  FOTO 51 current, 60 predicted FOTO 83% 04/23/23  SCREENING FOR RED FLAGS: Red flag questioning/screening reassuring    COGNITION: Overall cognitive status: Within functional limits for tasks assessed     SENSATION: Mild numbness L foot (pt states due to prior surgery), otherwise sensation intact all extremities    POSTURE: increased lumbar lordosis  PALPATION: Deferred given time constraints  LUMBAR ROM:   AROM eval  Flexion 100% (ankle joint line)  Extension 75% *   Right lateral flexion   Left lateral flexion   Right rotation 100% s*  Left rotation 75% s   (Blank rows = not tested) (Key: WFL = within functional limits not formally assessed, * = concordant pain, s = stiffness/stretching sensation, NT = not tested)   LOWER EXTREMITY ROM:     Active  Right eval Left eval  Hip flexion    Hip extension    Hip internal rotation    Hip external rotation    Knee extension    Knee flexion    (Blank rows = not tested) (Key: WFL = within functional limits not formally assessed, * = concordant pain, s = stiffness/stretching sensation, NT = not tested)  Comments:    LOWER EXTREMITY MMT:    MMT Right eval Left eval Right 04/23/23  Hip flexion 4 4   Hip abduction (modified sitting) 5 5   Hip internal rotation 4 5   Hip external rotation 3+ 5 4-  Knee flexion 4 4+   Knee extension 5 5   Ankle dorsiflexion 5 5    (Blank rows = not tested) (Key: WFL = within functional limits not formally assessed, * = concordant pain, s = stiffness/stretching sensation, NT = not tested)  Comments:    LUMBAR SPECIAL TESTS:  Deferred today  FUNCTIONAL TESTS:  5xSTS: 8 sec standard chair no UE support   GAIT: Distance walked: within clinic Assistive device utilized:  None Level of assistance: Complete Independence Comments: reduced gait speed/cadence  TODAY'S  TREATMENT:                                                                                                                              [facility] Adult PT Treatment:                                                DATE: 04/28/2023 Therapeutic Exercise: L low back stretch in supine with R LLE rotaiton 2 x 30 sec Supine posterior pelvic tilt x 5 holding 10 seconds with tactile cue for proper form and how to palpation for transverse activatoin Progressed to alternating marching 1 x 40 while maintaining abdominal draw in maneuver Progressed to 1 x 5 table top position holding 10 seconds Quadruped bird dog UE only 1 x 20 alternating L/R, LE 1 x 20, 1 x 10 UE/LE contralateral with tactile cues for safety  Palloff press 1 x 12 with Green theraband   Updated HEP for table top position and palloff press.  Manual Therapy: MTPR along the L lumbar paraspinals x 3   [facility] Adult PT Treatment:                                                DATE: 04/23/23 Therapeutic Exercise: Seated IR Red band Bilat with ball btw knees 10 x 3  Seated Red clam  Supine red clam  PPT 5 sec x 10  PPT with marching  PPT to bridge  Seated Row 25# Seated Lat pull 25# HEP    [facility] Adult PT Treatment:                                                DATE: 04/02/23 Therapeutic Exercise: Seated ER/IR RTB 2x8 each cues for setup and HEP Hooklying march RTB 2x8 BIL LE cues for setup and comfortable ROM, breath control  Posterior pelvic tilts x12 cues for breath control  Hooklying adduction 2x12 cues for pacing  STS BW lowest mat, 2x8 cues for pacing HEP handout + education   PATIENT EDUCATION:  Education details: rationale for interventions, HEP, relevant anatomy/physiology  Person educated: Patient Education method: Explanation, Demonstration, Tactile cues, Verbal cues, and Handouts Education comprehension: verbalized understanding, returned demonstration, verbal cues required, tactile cues required, and needs further education     HOME EXERCISE PROGRAM: Access Code: [email or web address] URL: [email or web address] Date: 04/28/2023 Prepared by: Lulu Riding  Exercises - Seated Hip Internal Rotation with Newman Pies and Resistance  - 2-3 x daily - 7 x  weekly - 1 sets - 8 reps - Supine Posterior Pelvic Tilt  - 2-3 x daily - 7 x weekly - 1 sets - 10 reps - Sit to Stand with Armchair  - 2-3 x daily - 7 x weekly - 1 sets - 8 reps - Supine March with Posterior Pelvic Tilt  - 1 x daily - 7 x weekly - 1-2 sets - 10 reps - Hooklying Clamshell with Resistance  - 2 x daily - 7 x weekly - 2-3 sets - 10 reps - 5 hold - Supine Bridge  - 1 x daily - 7 x weekly - 2-3 sets - 10 reps - Supine 90/90 Abdominal Bracing  - 1 x daily - 7 x weekly - 2 sets - 5 reps - 10 sec hold - Standing Anti-Rotation Press with Anchored Resistance  - 1 x daily - 7 x weekly - 2 sets - 10 reps  ASSESSMENT:  CLINICAL IMPRESSION: 04/28/2023 Mrs Costilow arrives to session reporting continued improvement but does not pain with prolonged standing/ activity at the gym located mostly in her low back. Focused on her low back which she responded well with MTPR along the L lumbar paraspinals and she noted significant relief of tension. Focused remained of session on core activation in supine with graduated activation. She did require tactile cues for posterior pelvic titl and palption for transverse abdominus activation, and need encouragement for birddog with UE/LE contralateral movement otherwise she did very well with all exercises. End of session she noted mild soreness from the exercises but denied any pain.     Per eval - Pt is a very pleasant 62 year old woman who arrives to PT evaluation on this date for low back pain. Pt reports difficulty with daily activities, standing, and walking due to pain. During today's session pt demonstrates limitations in lumbar mobility and LE strength (R more affected than L) which are likely contributing to difficulty with  aforementioned activities. Recommend skilled PT to address aforementioned deficits to improve functional independence/tolerance. HEP deferred given time constraints with late check in. No adverse events, pt tolerates exam well. Pt departs today's session in no acute distress, all voiced questions/concerns addressed appropriately from PT perspective.    OBJECTIVE IMPAIRMENTS: decreased activity tolerance, decreased endurance, decreased mobility, difficulty walking, decreased ROM, decreased strength, improper body mechanics, postural dysfunction, and pain.   ACTIVITY LIMITATIONS: carrying, lifting, bending, standing, stairs, transfers, and locomotion level  PARTICIPATION LIMITATIONS: meal prep, cleaning, laundry, and community activity  PERSONAL FACTORS: Age, Time since onset of injury/illness/exacerbation, and 3+ comorbidities: anxiety, COPD, HTN, sickle cell trait  are also affecting patient's functional outcome.   REHAB POTENTIAL: Fair given chronicity  CLINICAL DECISION MAKING: Stable/uncomplicated  EVALUATION COMPLEXITY: Low   GOALS: Goals reviewed with patient? No given time constraints  SHORT TERM GOALS: Target date: 04/21/2023    Pt will demonstrate appropriate understanding and performance of initially prescribed HEP in order to facilitate improved independence with management of symptoms.  Baseline: HEP provided on eval Goal status: ONGOING  2. Pt will score greater than or equal to 56 on FOTO in order to demonstrate improved perception of function due to symptoms.  Baseline: 51  Goal status: MET   LONG TERM GOALS: Target date: 05/12/2023   Pt will score 60 on FOTO in order to demonstrate improved perception of functional status due to symptoms.  Baseline: 51 04/23/23: 83 Goal status: MET  2.  Pt will demonstrate symmetrical and painless lumbar rotation AROM in order  to demonstrate improved tolerance to functional movement patterns.  Baseline: see ROM chart above Goal  status: INITIAL  3.  Pt will demonstrate grossly symmetrical hip/knee MMT in order to demonstrate improved strength for functional movements.  Baseline: see MMT chart above Goal status: INITIAL  4. Pt will report/demonstrate ability to walk a mile with less than 2 pt increase in pain on NPS in order to facilitate improved community navigation and overall health/QOL.  Baseline: pain w/ half a mile  Goal status: INITIAL  5. Pt will report/demonstrate ability to tolerate standing for up to with less than 2 pt increase in pain on NPS in order to facilitate improved tolerance to household tasks.  Baseline: standing tolerance <85min   Goal status: INITIAL  6. Pt will be able to navigate 12 stairs with unilat rail with less than 2 pt increase in pain on NPS in order to facilitate improved independence w/ navigation in home.   Baseline: reports difficulty w/ stairs due to low back and COPD  Goal status: INITIAL   PLAN:  PT FREQUENCY: 2x/week  PT DURATION: 6 weeks  PLANNED INTERVENTIONS: Therapeutic exercises, Therapeutic activity, Neuromuscular re-education, Balance training, Gait training, Patient/Family education, Self Care, Joint mobilization, Stair training, Aquatic Therapy, Dry Needling, Electrical stimulation, Spinal mobilization, Cryotherapy, Moist heat, Taping, Manual therapy, and Re-evaluation.  PLAN FOR NEXT SESSION: review/update HEP PRN. Emphasis on postural endurance, hip/LE strengthening. Symptom modification strategies as indicated/appropriate.  Balance    Yashira Offenberger PT, DPT, LAT, ATC  04/28/23  10:20 AM

## 2023-04-29 NOTE — Therapy (Signed)
OUTPATIENT PHYSICAL THERAPY TREATMENT NOTE    Patient Name: RENAI RHYNER MRN: [id number] DOB:[birth year], 62 y.o., female Today's Date: 04/30/2023  END OF SESSION:  PT End of Session - 04/30/23 1017     Visit Number 5    Number of Visits 13    Date for PT Re-Evaluation 05/12/23    Authorization Type [employer]    Progress Note Due on Visit 10    PT Start Time 1017    PT Stop Time 1056    PT Time Calculation (min) 39 min    Activity Tolerance Patient tolerated treatment well    Behavior During Therapy WFL for tasks assessed/performed                Past Medical History:  Diagnosis Date   Anxiety    Arthritis    Blood transfusion without reported diagnosis    COPD (chronic obstructive pulmonary disease) (HCC)    Hypertension    Sickle cell trait (HCC)    Past Surgical History:  Procedure Laterality Date   ABDOMINAL HYSTERECTOMY     COLONOSCOPY  2007 and 2014   FOOT SURGERY     JOINT REPLACEMENT     ROTATOR CUFF REPAIR     TOTAL HIP ARTHROPLASTY     Patient Active Problem List   Diagnosis Date Noted   Acquired hallux rigidus 09/01/2019   Lumbar radiculopathy 09/01/2019   Spondylolisthesis 09/01/2019   Other atopic dermatitis 02/12/2019   COPD (chronic obstructive pulmonary disease) (HCC) 07/22/2018   Prediabetes 02/24/2018   Vitamin D deficiency 10/22/2016   Primary insomnia 10/20/2015   Moderate episode of recurrent major depressive disorder (HCC) 07/05/2015   Subclinical hyperthyroidism 07/05/2015   Bilateral carpal tunnel syndrome 05/31/2015   Primary osteoarthritis involving multiple joints 05/31/2015   Chronic low back pain without sciatica 05/02/2015   Cigarette nicotine dependence without complication 05/02/2015   Primary osteoarthritis of hip 05/02/2015   Back pain 02/01/2013   Hip pain 02/01/2013   FACIAL PAIN 11/28/2010   TOBACCO USER 11/16/2009   BRONCHITIS 07/17/2009   ANEMIA, IRON DEFICIENCY 06/01/2009   HYPERTENSION 06/01/2009    PCP:  Solmon Ice, MD  REFERRING PROVIDER: Solmon Ice, MD  REFERRING DIAG: M54.50 (ICD-10-CM) - Low back pain G89.29 (ICD-10-CM) - Other chronic pain  Rationale for Evaluation and Treatment: Rehabilitation  THERAPY DIAG:  Other low back pain  Muscle weakness (generalized)  Difficulty in walking, not elsewhere classified  ONSET DATE: since 2015  SUBJECTIVE:  Per eval - Pt states she began to develop back pain after THA in 2015. States she has some disc issues but is trying to avoid surgery. Gradually worsening, has difficulty with standing. Bothers her when cleaning, dishes, etc. Typically fairly active, enjoys walking (up to a mile usually). Has to take a lot of rest but is able to do most of what she needs to do. Goes to gym, does crunches and LE strengthening.  No N/T, no buckling, no bowel/bladder, no saddle anesthesia.   SUBJECTIVE STATEMENT: 04/30/2023 Pt states she has been continuing to feel better, no pain at present. Got good relief from manual last session. Still having trouble with standing.    PERTINENT HISTORY:  PMH: anxiety, COPD, HTN, sickle cell trait, HTN  PAIN:  Are you having pain: , no pain  Location/description: belt line, BIL  Per eval -  - aggravating factors: standing >16min, walking half a mile, stair navigation - Easing factors: rest, leaning forward    PRECAUTIONS: None  WEIGHT BEARING RESTRICTIONS: No  FALLS:  Has patient fallen in last 6 months? No (end of last year)  LIVING ENVIRONMENT: Townhouse, 12 steps upstairs one rail, no STE Lives w/ boyfriend   OCCUPATION: not working since 2023  PLOF: Independent  PATIENT GOALS: wants to be able to stand, avoid using AD  NEXT MD VISIT: 3 months (from eval, June 2024)  OBJECTIVE: (objective measures  completed at initial evaluation unless otherwise dated)   DIAGNOSTIC FINDINGS:  Most recent spine imaging October 2023, refer to epic for details, did show spondylolisthesis L3-4 and L4-5  PATIENT SURVEYS:  FOTO 51 current, 60 predicted FOTO 83% 04/23/23  SCREENING FOR RED FLAGS: Red flag questioning/screening reassuring    COGNITION: Overall cognitive status: Within functional limits for tasks assessed     SENSATION: Mild numbness L foot (pt states due to prior surgery), otherwise sensation intact all extremities    POSTURE: increased lumbar lordosis  PALPATION: Deferred given time constraints  LUMBAR ROM:   AROM eval  Flexion 100% (ankle joint line)  Extension 75% *   Right lateral flexion   Left lateral flexion   Right rotation 100% s*  Left rotation 75% s   (Blank rows = not tested) (Key: WFL = within functional limits not formally assessed, * = concordant pain, s = stiffness/stretching sensation, NT = not tested)   LOWER EXTREMITY ROM:     Active  Right eval Left eval  Hip flexion    Hip extension    Hip internal rotation    Hip external rotation    Knee extension    Knee flexion    (Blank rows = not tested) (Key: WFL = within functional limits not formally assessed, * = concordant pain, s = stiffness/stretching sensation, NT = not tested)  Comments:    LOWER EXTREMITY MMT:    MMT Right eval Left eval Right 04/23/23  Hip flexion 4 4   Hip abduction (modified sitting) 5 5   Hip internal rotation 4 5   Hip external rotation 3+ 5 4-  Knee flexion 4 4+   Knee extension 5 5   Ankle dorsiflexion 5 5    (Blank rows = not tested) (Key: WFL = within functional limits not formally assessed, * = concordant pain, s = stiffness/stretching sensation, NT = not tested)  Comments:    LUMBAR SPECIAL TESTS:  Deferred today  FUNCTIONAL TESTS:  5xSTS: 8 sec standard chair no UE support   GAIT: Distance walked: within  clinic Assistive device utilized:  None Level of assistance: Complete Independence Comments: reduced gait speed/cadence  TODAY'S TREATMENT:                                                                                                                              [facility] Adult PT Treatment:                                                DATE: 04/30/23 Therapeutic Exercise: Nustep LE/UE 5 min during subjective Seated marches w/ red band 2x12 BIL cues for posture and trunk mechanics Standing green band row 2x15 cues for posture Standing red band shoulder ext 2x12 on airex pad Short lever STS 5# x10 lowest mat cues for setup Medium lever STS 5# 2x6 lowest mat cues for posture   [facility] Adult PT Treatment:                                                DATE: 04/28/2023 Therapeutic Exercise: L low back stretch in supine with R LLE rotaiton 2 x 30 sec Supine posterior pelvic tilt x 5 holding 10 seconds with tactile cue for proper form and how to palpation for transverse activatoin Progressed to alternating marching 1 x 40 while maintaining abdominal draw in maneuver Progressed to 1 x 5 table top position holding 10 seconds Quadruped bird dog UE only 1 x 20 alternating L/R, LE 1 x 20, 1 x 10 UE/LE contralateral with tactile cues for safety  Palloff press 1 x 12 with Green theraband   Updated HEP for table top position and palloff press.  Manual Therapy: MTPR along the L lumbar paraspinals x 3   [facility] Adult PT Treatment:                                                DATE: 04/23/23 Therapeutic Exercise: Seated IR Red band Bilat with ball btw knees 10 x 3  Seated Red clam  Supine red clam  PPT 5 sec x 10  PPT with marching  PPT to bridge  Seated Row 25# Seated Lat pull 25# HEP    [facility] Adult PT Treatment:                                                DATE: 04/02/23 Therapeutic Exercise: Seated ER/IR RTB 2x8 each cues for setup and HEP Hooklying march RTB 2x8 BIL LE  cues for setup and comfortable ROM, breath control   Posterior pelvic tilts x12 cues for breath control  Hooklying adduction 2x12 cues for pacing  STS BW lowest mat, 2x8 cues for pacing HEP handout + education   PATIENT EDUCATION:  Education details: rationale for interventions, HEP, relevant anatomy/physiology  Person educated: Patient Education method: Explanation, Demonstration, Tactile cues, Verbal cues, and Handouts Education comprehension: verbalized understanding, returned demonstration, verbal cues required, tactile cues required, and needs further education    HOME EXERCISE PROGRAM: Access Code: [email or web address] URL: [email or web address] Date: 04/28/2023 Prepared by: Lulu Riding  Exercises - Seated Hip Internal Rotation with Newman Pies and Resistance  - 2-3 x daily - 7 x weekly - 1 sets - 8 reps - Supine Posterior Pelvic Tilt  - 2-3 x daily - 7 x weekly - 1 sets - 10 reps - Sit to Stand with Armchair  - 2-3 x daily - 7 x weekly - 1 sets - 8 reps - Supine March with Posterior Pelvic Tilt  - 1 x daily - 7 x weekly - 1-2 sets - 10 reps - Hooklying Clamshell with Resistance  - 2 x daily - 7 x weekly - 2-3 sets - 10 reps - 5 hold - Supine Bridge  - 1 x daily - 7 x weekly - 2-3 sets - 10 reps - Supine 90/90 Abdominal Bracing  - 1 x daily - 7 x weekly - 2 sets - 5 reps - 10 sec hold - Standing Anti-Rotation Press with Anchored Resistance  - 1 x daily - 7 x weekly - 2 sets - 10 reps  ASSESSMENT:  CLINICAL IMPRESSION: 04/30/2023 Pt arrives w/o pain, continues to report progress although most difficulty remains with prolonged standing. Today focusing on increasing resistance for hip flexor work and increasing time with postural/extensor musculature in standing to improve endurance. No adverse events or increase in resting pain, report of muscle fatigue and transient pain with prolonged standing exercise that is mitigated by seated rest. Departs without any pain, reports reduced low back stiffness. Recommend continuing along  current POC in order to address relevant deficits and improve functional tolerance. Pt departs today's session in no acute distress, all voiced questions/concerns addressed appropriately from PT perspective.     Per eval - Pt is a very pleasant 62 year old woman who arrives to PT evaluation on this date for low back pain. Pt reports difficulty with daily activities, standing, and walking due to pain. During today's session pt demonstrates limitations in lumbar mobility and LE strength (R more affected than L) which are likely contributing to difficulty with aforementioned activities. Recommend skilled PT to address aforementioned deficits to improve functional independence/tolerance. HEP deferred given time constraints with late check in. No adverse events, pt tolerates exam well. Pt departs today's session in no acute distress, all voiced questions/concerns addressed appropriately from PT perspective.    OBJECTIVE IMPAIRMENTS: decreased activity tolerance, decreased endurance, decreased mobility, difficulty walking, decreased ROM, decreased strength, improper body mechanics, postural dysfunction, and pain.   ACTIVITY LIMITATIONS: carrying, lifting, bending, standing, stairs, transfers, and locomotion level  PARTICIPATION LIMITATIONS: meal prep, cleaning, laundry, and community activity  PERSONAL FACTORS: Age, Time since onset of injury/illness/exacerbation, and 3+ comorbidities: anxiety, COPD, HTN, sickle cell trait  are also affecting patient's functional outcome.   REHAB POTENTIAL: Fair given chronicity  CLINICAL DECISION MAKING: Stable/uncomplicated  EVALUATION COMPLEXITY: Low   GOALS: Goals reviewed with patient? No given time constraints  SHORT TERM GOALS: Target date: 04/21/2023  Pt will demonstrate appropriate understanding and performance of initially prescribed HEP in order to facilitate improved independence with management of symptoms.  Baseline: HEP provided on eval Goal  status: ONGOING  2. Pt will score greater than or equal to 56 on FOTO in order to demonstrate improved perception of function due to symptoms.  Baseline: 51  Goal status: MET   LONG TERM GOALS: Target date: 05/12/2023   Pt will score 60 on FOTO in order to demonstrate improved perception of functional status due to symptoms.  Baseline: 51 04/23/23: 83 Goal status: MET  2.  Pt will demonstrate symmetrical and painless lumbar rotation AROM in order to demonstrate improved tolerance to functional movement patterns.  Baseline: see ROM chart above Goal status: INITIAL  3.  Pt will demonstrate grossly symmetrical hip/knee MMT in order to demonstrate improved strength for functional movements.  Baseline: see MMT chart above Goal status: INITIAL  4. Pt will report/demonstrate ability to walk a mile with less than 2 pt increase in pain on NPS in order to facilitate improved community navigation and overall health/QOL.  Baseline: pain w/ half a mile  Goal status: INITIAL  5. Pt will report/demonstrate ability to tolerate standing for up to with less than 2 pt increase in pain on NPS in order to facilitate improved tolerance to household tasks.  Baseline: standing tolerance <23min   Goal status: INITIAL  6. Pt will be able to navigate 12 stairs with unilat rail with less than 2 pt increase in pain on NPS in order to facilitate improved independence w/ navigation in home.   Baseline: reports difficulty w/ stairs due to low back and COPD  Goal status: INITIAL   PLAN:  PT FREQUENCY: 2x/week  PT DURATION: 6 weeks  PLANNED INTERVENTIONS: Therapeutic exercises, Therapeutic activity, Neuromuscular re-education, Balance training, Gait training, Patient/Family education, Self Care, Joint mobilization, Stair training, Aquatic Therapy, Dry Needling, Electrical stimulation, Spinal mobilization, Cryotherapy, Moist heat, Taping, Manual therapy, and Re-evaluation.  PLAN FOR NEXT SESSION:  review/update HEP PRN. Emphasis on postural endurance, hip/LE strengthening. Symptom modification strategies as indicated/appropriate.  Gradually work more on standing activities and endurance.    Ashley Murrain PT, DPT 04/30/2023 10:59 AM

## 2023-04-30 ENCOUNTER — Encounter: Payer: Self-pay | Admitting: Physical Therapy

## 2023-04-30 ENCOUNTER — Ambulatory Visit: Payer: 59 | Admitting: Physical Therapy

## 2023-04-30 DIAGNOSIS — M6281 Muscle weakness (generalized): Secondary | ICD-10-CM

## 2023-04-30 DIAGNOSIS — R262 Difficulty in walking, not elsewhere classified: Secondary | ICD-10-CM

## 2023-04-30 DIAGNOSIS — M5459 Other low back pain: Secondary | ICD-10-CM

## 2023-05-05 ENCOUNTER — Encounter: Payer: Self-pay | Admitting: Physical Therapy

## 2023-05-05 ENCOUNTER — Ambulatory Visit: Payer: 59 | Admitting: Physical Therapy

## 2023-05-05 DIAGNOSIS — M5459 Other low back pain: Secondary | ICD-10-CM | POA: Diagnosis not present

## 2023-05-05 DIAGNOSIS — M6281 Muscle weakness (generalized): Secondary | ICD-10-CM

## 2023-05-05 NOTE — Therapy (Signed)
OUTPATIENT PHYSICAL THERAPY TREATMENT NOTE    Patient Name: Katherine Jarvis MRN: [id number] DOB:23-Oct-1960, 62 y.o., female Today's Date: 05/05/2023  END OF SESSION:  PT End of Session - 05/05/23 1024     Visit Number 6    Number of Visits 13    Date for PT Re-Evaluation 05/12/23    Authorization Type [employer]    Progress Note Due on Visit 10    PT Start Time 1022    PT Stop Time 1100    PT Time Calculation (min) 38 min                Past Medical History:  Diagnosis Date   Anxiety    Arthritis    Blood transfusion without reported diagnosis    COPD (chronic obstructive pulmonary disease) (HCC)    Hypertension    Sickle cell trait (HCC)    Past Surgical History:  Procedure Laterality Date   ABDOMINAL HYSTERECTOMY     COLONOSCOPY  2007 and 2014   FOOT SURGERY     JOINT REPLACEMENT     ROTATOR CUFF REPAIR     TOTAL HIP ARTHROPLASTY     Patient Active Problem List   Diagnosis Date Noted   Acquired hallux rigidus 09/01/2019   Lumbar radiculopathy 09/01/2019   Spondylolisthesis 09/01/2019   Other atopic dermatitis 02/12/2019   COPD (chronic obstructive pulmonary disease) (HCC) 07/22/2018   Prediabetes 02/24/2018   Vitamin D deficiency 10/22/2016   Primary insomnia 10/20/2015   Moderate episode of recurrent major depressive disorder (HCC) 07/05/2015   Subclinical hyperthyroidism 07/05/2015   Bilateral carpal tunnel syndrome 05/31/2015   Primary osteoarthritis involving multiple joints 05/31/2015   Chronic low back pain without sciatica 05/02/2015   Cigarette nicotine dependence without complication 05/02/2015   Primary osteoarthritis of hip 05/02/2015   Back pain 02/01/2013   Hip pain 02/01/2013   FACIAL PAIN 11/28/2010   TOBACCO USER 11/16/2009   BRONCHITIS 07/17/2009   ANEMIA, IRON DEFICIENCY 06/01/2009   HYPERTENSION 06/01/2009    PCP: Solmon Ice, MD  REFERRING PROVIDER: Solmon Ice, MD  REFERRING DIAG: M54.50 (ICD-10-CM) - Low back  pain G89.29 (ICD-10-CM) - Other chronic pain  Rationale for Evaluation and Treatment: Rehabilitation  THERAPY DIAG:  Other low back pain  Muscle weakness (generalized)  ONSET DATE: since 2015  SUBJECTIVE:                                                                                                                                                                                          Per eval - Pt states she began to develop back pain after THA in  2015. States she has some disc issues but is trying to avoid surgery. Gradually worsening, has difficulty with standing. Bothers her when cleaning, dishes, etc. Typically fairly active, enjoys walking (up to a mile usually). Has to take a lot of rest but is able to do most of what she needs to do. Goes to gym, does crunches and LE strengthening.  No N/T, no buckling, no bowel/bladder, no saddle anesthesia.   SUBJECTIVE STATEMENT: 05/05/2023 Pt states she has been continuing to feel better, no pain at present. Still has trouble with standing.  Mornings are good  PERTINENT HISTORY:  PMH: anxiety, COPD, HTN, sickle cell trait, HTN  PAIN:  Are you having pain: , no pain  Location/description: belt line, BIL  Per eval -  - aggravating factors: standing >73min, walking half a mile, stair navigation - Easing factors: rest, leaning forward    PRECAUTIONS: None  WEIGHT BEARING RESTRICTIONS: No  FALLS:  Has patient fallen in last 6 months? No (end of last year)  LIVING ENVIRONMENT: Townhouse, 12 steps upstairs one rail, no STE Lives w/ boyfriend   OCCUPATION: not working since 2023  PLOF: Independent  PATIENT GOALS: wants to be able to stand, avoid using AD  NEXT MD VISIT: 3 months (from eval, June 2024)  OBJECTIVE: (objective measures completed at initial evaluation unless otherwise dated)   DIAGNOSTIC FINDINGS:  Most recent spine imaging October 2023, refer to epic for details, did show spondylolisthesis L3-4 and  L4-5  PATIENT SURVEYS:  FOTO 51 current, 60 predicted FOTO 83% 04/23/23  SCREENING FOR RED FLAGS: Red flag questioning/screening reassuring    COGNITION: Overall cognitive status: Within functional limits for tasks assessed     SENSATION: Mild numbness L foot (pt states due to prior surgery), otherwise sensation intact all extremities    POSTURE: increased lumbar lordosis  PALPATION: Deferred given time constraints  LUMBAR ROM:   AROM eval  Flexion 100% (ankle joint line)  Extension 75% *   Right lateral flexion   Left lateral flexion   Right rotation 100% s*  Left rotation 75% s   (Blank rows = not tested) (Key: WFL = within functional limits not formally assessed, * = concordant pain, s = stiffness/stretching sensation, NT = not tested)   LOWER EXTREMITY ROM:     Active  Right eval Left eval  Hip flexion    Hip extension    Hip internal rotation    Hip external rotation    Knee extension    Knee flexion    (Blank rows = not tested) (Key: WFL = within functional limits not formally assessed, * = concordant pain, s = stiffness/stretching sensation, NT = not tested)  Comments:    LOWER EXTREMITY MMT:    MMT Right eval Left eval Right 04/23/23  Hip flexion 4 4   Hip abduction (modified sitting) 5 5   Hip internal rotation 4 5   Hip external rotation 3+ 5 4-  Knee flexion 4 4+   Knee extension 5 5   Ankle dorsiflexion 5 5    (Blank rows = not tested) (Key: WFL = within functional limits not formally assessed, * = concordant pain, s = stiffness/stretching sensation, NT = not tested)  Comments:    LUMBAR SPECIAL TESTS:  Deferred today  FUNCTIONAL TESTS:  5xSTS: 8 sec standard chair no UE support   GAIT: Distance walked: within clinic Assistive device utilized: None Level of assistance: Complete Independence Comments: reduced gait speed/cadence  TODAY'S TREATMENT:  [facility] Adult PT Treatment:                                                DATE: 05/05/23 Therapeutic Exercise: Nustep L 5 UE/LE x 5 minutes Green band pallof press green band 10 x 1 each  L stretch at counter Standing hip abduction x 10 each  L stretch Standing hp flex, alternating x 10each L stretch Standing rows green   L stretch  Seated lumbar flexion  90/90 brace 15 sec x 2  90/90 toe taps  x 4, 2 rounds Bird dogs , 5 sec, difficult   Therapeutic Activity: 16 stairs -stair negotiation in clinic without HR- tiredness in legs, no increased pain.    [facility] Adult PT Treatment:                                                DATE: 04/30/23 Therapeutic Exercise: Nustep LE/UE 5 min during subjective Seated marches w/ red band 2x12 BIL cues for posture and trunk mechanics Standing green band row 2x15 cues for posture Standing red band shoulder ext 2x12 on airex pad Short lever STS 5# x10 lowest mat cues for setup Medium lever STS 5# 2x6 lowest mat cues for posture   [facility] Adult PT Treatment:                                                DATE: 04/28/2023 Therapeutic Exercise: L low back stretch in supine with R LLE rotaiton 2 x 30 sec Supine posterior pelvic tilt x 5 holding 10 seconds with tactile cue for proper form and how to palpation for transverse activatoin Progressed to alternating marching 1 x 40 while maintaining abdominal draw in maneuver Progressed to 1 x 5 table top position holding 10 seconds Quadruped bird dog UE only 1 x 20 alternating L/R, LE 1 x 20, 1 x 10 UE/LE contralateral with tactile cues for safety  Palloff press 1 x 12 with Green theraband   Updated HEP for table top position and palloff press.  Manual Therapy: MTPR along the L lumbar paraspinals x 3      PATIENT EDUCATION:  Education details: rationale for interventions, HEP, relevant anatomy/physiology  Person educated: Patient Education method: Explanation,  Demonstration, Tactile cues, Verbal cues, and Handouts Education comprehension: verbalized understanding, returned demonstration, verbal cues required, tactile cues required, and needs further education    HOME EXERCISE PROGRAM: Access Code: [email or web address] URL: [email or web address] Date: 04/28/2023 Prepared by: Lulu Riding  Exercises - Seated Hip Internal Rotation with Newman Pies and Resistance  - 2-3 x daily - 7 x weekly - 1 sets - 8 reps - Supine Posterior Pelvic Tilt  - 2-3 x daily - 7 x weekly - 1 sets - 10 reps - Sit to Stand with Armchair  - 2-3 x daily - 7 x weekly - 1 sets - 8 reps - Supine March with Posterior Pelvic Tilt  - 1 x daily - 7 x weekly - 1-2 sets - 10 reps - Hooklying Clamshell with Resistance  - 2 x daily - 7 x weekly - 2-3  sets - 10 reps - 5 hold - Supine Bridge  - 1 x daily - 7 x weekly - 2-3 sets - 10 reps - Supine 90/90 Abdominal Bracing  - 1 x daily - 7 x weekly - 2 sets - 5 reps - 10 sec hold - Standing Anti-Rotation Press with Anchored Resistance  - 1 x daily - 7 x weekly - 2 sets - 10 reps  ASSESSMENT:  CLINICAL IMPRESSION: 05/05/2023 Continued with closed chain endurance which she did well when performing lumbar stretch at counter between exercises. Able to tolerate 10 minutes on her feet while performing standing hip and core exercises. Progressed 90/90 bracing to toe taps with good tolerance. Able to negotiate 16 stairs without UE and without increased pain, LTG# 6 met. She admits that she did not complete her HEP since last visit, due to being busy. No changes to HEP at this visit.    Per eval - Pt is a very pleasant 62 year old woman who arrives to PT evaluation on this date for low back pain. Pt reports difficulty with daily activities, standing, and walking due to pain. During today's session pt demonstrates limitations in lumbar mobility and LE strength (R more affected than L) which are likely contributing to difficulty with aforementioned  activities. Recommend skilled PT to address aforementioned deficits to improve functional independence/tolerance. HEP deferred given time constraints with late check in. No adverse events, pt tolerates exam well. Pt departs today's session in no acute distress, all voiced questions/concerns addressed appropriately from PT perspective.    OBJECTIVE IMPAIRMENTS: decreased activity tolerance, decreased endurance, decreased mobility, difficulty walking, decreased ROM, decreased strength, improper body mechanics, postural dysfunction, and pain.   ACTIVITY LIMITATIONS: carrying, lifting, bending, standing, stairs, transfers, and locomotion level  PARTICIPATION LIMITATIONS: meal prep, cleaning, laundry, and community activity  PERSONAL FACTORS: Age, Time since onset of injury/illness/exacerbation, and 3+ comorbidities: anxiety, COPD, HTN, sickle cell trait  are also affecting patient's functional outcome.   REHAB POTENTIAL: Fair given chronicity  CLINICAL DECISION MAKING: Stable/uncomplicated  EVALUATION COMPLEXITY: Low   GOALS: Goals reviewed with patient? No given time constraints  SHORT TERM GOALS: Target date: 04/21/2023    Pt will demonstrate appropriate understanding and performance of initially prescribed HEP in order to facilitate improved independence with management of symptoms.  Baseline: HEP provided on eval 05/05/23: I dont do them regularly, busy Goal status: ONGOING  2. Pt will score greater than or equal to 56 on FOTO in order to demonstrate improved perception of function due to symptoms.  Baseline: 51  Goal status: MET   LONG TERM GOALS: Target date: 05/12/2023   Pt will score 60 on FOTO in order to demonstrate improved perception of functional status due to symptoms.  Baseline: 51 04/23/23: 83 Goal status: MET  2.  Pt will demonstrate symmetrical and painless lumbar rotation AROM in order to demonstrate improved tolerance to functional movement patterns.  Baseline: see  ROM chart above Goal status: INITIAL  3.  Pt will demonstrate grossly symmetrical hip/knee MMT in order to demonstrate improved strength for functional movements.  Baseline: see MMT chart above Goal status: INITIAL  4. Pt will report/demonstrate ability to walk a mile with less than 2 pt increase in pain on NPS in order to facilitate improved community navigation and overall health/QOL.  Baseline: pain w/ half a mile  Goal status: INITIAL  5. Pt will report/demonstrate ability to tolerate standing for up to with less than 2 pt increase in pain  on NPS in order to facilitate improved tolerance to household tasks.  Baseline: standing tolerance <16min   Goal status: INITIAL  6. Pt will be able to navigate 12 stairs with unilat rail with less than 2 pt increase in pain on NPS in order to facilitate improved independence w/ navigation in home.   Baseline: reports difficulty w/ stairs due to low back and COPD  05/05/23: able to negotiate 16 stairs in clinic without increased pain , no HR  Goal status: MET  PLAN:  PT FREQUENCY: 2x/week  PT DURATION: 6 weeks  PLANNED INTERVENTIONS: Therapeutic exercises, Therapeutic activity, Neuromuscular re-education, Balance training, Gait training, Patient/Family education, Self Care, Joint mobilization, Stair training, Aquatic Therapy, Dry Needling, Electrical stimulation, Spinal mobilization, Cryotherapy, Moist heat, Taping, Manual therapy, and Re-evaluation.  PLAN FOR NEXT SESSION: consider extending POC: review/update HEP PRN. Emphasis on postural endurance, hip/LE strengthening. Symptom modification strategies as indicated/appropriate.  Gradually work more on standing activities and endurance.    Jannette Spanner, PTA 05/05/23 1:08 PM Phone: [phone] Fax: [phone]

## 2023-05-07 ENCOUNTER — Encounter: Payer: Self-pay | Admitting: Physical Therapy

## 2023-05-07 ENCOUNTER — Ambulatory Visit: Payer: 59 | Admitting: Physical Therapy

## 2023-05-07 DIAGNOSIS — M5459 Other low back pain: Secondary | ICD-10-CM

## 2023-05-07 DIAGNOSIS — R262 Difficulty in walking, not elsewhere classified: Secondary | ICD-10-CM

## 2023-05-07 DIAGNOSIS — M6281 Muscle weakness (generalized): Secondary | ICD-10-CM

## 2023-05-07 NOTE — Therapy (Signed)
OUTPATIENT PHYSICAL THERAPY TREATMENT NOTE + DISCHARGE SUMMARY   Patient Name: Katherine Jarvis MRN: [id number] DOB:[birth year], 62 y.o., female Today's Date: 05/07/2023   PHYSICAL THERAPY DISCHARGE SUMMARY  Visits from Start of Care: 7  Current functional level related to goals / functional outcomes: Able to perform daily activities with pain and rest breaks; remains active in gym, playing pickleball    Remaining deficits: Pain with standing, mild lumbar mobility deficits,  hip weakness   Education / Equipment: HEP, discharge education, follow up with provided   Patient agrees to discharge. Patient goals were partially met. Patient is being discharged due to  report of minimal change in symptoms, following back up with provider.   END OF SESSION:  PT End of Session - 05/07/23 1016     Visit Number 7    Number of Visits 13    Date for PT Re-Evaluation 05/12/23    Authorization Type [employer]    Progress Note Due on Visit 10    PT Start Time 1016    PT Stop Time 1051    PT Time Calculation (min) 35 min    Activity Tolerance Patient tolerated treatment well;No increased pain    Behavior During Therapy WFL for tasks assessed/performed                 Past Medical History:  Diagnosis Date   Anxiety    Arthritis    Blood transfusion without reported diagnosis    COPD (chronic obstructive pulmonary disease) (HCC)    Hypertension    Sickle cell trait (HCC)    Past Surgical History:  Procedure Laterality Date   ABDOMINAL HYSTERECTOMY     COLONOSCOPY  2007 and 2014   FOOT SURGERY     JOINT REPLACEMENT     ROTATOR CUFF REPAIR     TOTAL HIP ARTHROPLASTY     Patient Active Problem List   Diagnosis Date Noted   Acquired hallux rigidus 09/01/2019   Lumbar radiculopathy 09/01/2019   Spondylolisthesis 09/01/2019   Other atopic dermatitis 02/12/2019   COPD (chronic obstructive pulmonary disease) (HCC) 07/22/2018   Prediabetes 02/24/2018   Vitamin D deficiency  10/22/2016   Primary insomnia 10/20/2015   Moderate episode of recurrent major depressive disorder (HCC) 07/05/2015   Subclinical hyperthyroidism 07/05/2015   Bilateral carpal tunnel syndrome 05/31/2015   Primary osteoarthritis involving multiple joints 05/31/2015   Chronic low back pain without sciatica 05/02/2015   Cigarette nicotine dependence without complication 05/02/2015   Primary osteoarthritis of hip 05/02/2015   Back pain 02/01/2013   Hip pain 02/01/2013   FACIAL PAIN 11/28/2010   TOBACCO USER 11/16/2009   BRONCHITIS 07/17/2009   ANEMIA, IRON DEFICIENCY 06/01/2009   HYPERTENSION 06/01/2009    PCP: Solmon Ice, MD  REFERRING PROVIDER: Solmon Ice, MD  REFERRING DIAG: M54.50 (ICD-10-CM) - Low back pain G89.29 (ICD-10-CM) - Other chronic pain  Rationale for Evaluation and Treatment: Rehabilitation  THERAPY DIAG:  Other low back pain  Muscle weakness (generalized)  Difficulty in walking, not elsewhere classified  ONSET DATE: since 2015  SUBJECTIVE:  Per eval - Pt states she began to develop back pain after THA in 2015. States she has some disc issues but is trying to avoid surgery. Gradually worsening, has difficulty with standing. Bothers her when cleaning, dishes, etc. Typically fairly active, enjoys walking (up to a mile usually). Has to take a lot of rest but is able to do most of what she needs to do. Goes to gym, does crunches and LE strengthening.  No N/T, no buckling, no bowel/bladder, no saddle anesthesia.   SUBJECTIVE STATEMENT: 05/07/2023 Pt states she feels like her symptoms continue to fluctuate with activity, mostly standing and basic household activities. States she remains fairly active, going to gym and playing pickleball and states this doesn't seem to bother her  symptoms. Denies any significant change in symptom frequency/intensity since start of care.    PERTINENT HISTORY:  PMH: anxiety, COPD, HTN, sickle cell trait, HTN  PAIN:  Are you having pain: no pain  Location/description: belt line, BIL  Per eval -  - aggravating factors: standing >44min, walking half a mile, stair navigation - Easing factors: rest, leaning forward    PRECAUTIONS: None  WEIGHT BEARING RESTRICTIONS: No  FALLS:  Has patient fallen in last 6 months? No (end of last year)  LIVING ENVIRONMENT: Townhouse, 12 steps upstairs one rail, no STE Lives w/ boyfriend   OCCUPATION: not working since 2023  PLOF: Independent  PATIENT GOALS: wants to be able to stand, avoid using AD  NEXT MD VISIT: 3 months (from eval, June 2024)  OBJECTIVE: (objective measures completed at initial evaluation unless otherwise dated)   DIAGNOSTIC FINDINGS:  Most recent spine imaging October 2023, refer to epic for details, did show spondylolisthesis L3-4 and L4-5  PATIENT SURVEYS:  FOTO 51 current, 60 predicted FOTO 83% 04/23/23  SCREENING FOR RED FLAGS: Red flag questioning/screening reassuring    COGNITION: Overall cognitive status: Within functional limits for tasks assessed     SENSATION: Mild numbness L foot (pt states due to prior surgery), otherwise sensation intact all extremities    POSTURE: increased lumbar lordosis  PALPATION: Deferred given time constraints  LUMBAR ROM:   AROM eval 05/07/23  Flexion 100% (ankle joint line)   Extension 75% *  100% painless  Right lateral flexion    Left lateral flexion    Right rotation 100% s* 100%   Left rotation 75% s 75% s   (Blank rows = not tested) (Key: WFL = within functional limits not formally assessed, * = concordant pain, s = stiffness/stretching sensation, NT = not tested)   LOWER EXTREMITY ROM:     Active  Right eval Left eval  Hip flexion    Hip extension    Hip internal rotation    Hip external  rotation    Knee extension    Knee flexion    (Blank rows = not tested) (Key: WFL = within functional limits not formally assessed, * = concordant pain, s = stiffness/stretching sensation, NT = not tested)  Comments:    LOWER EXTREMITY MMT:    MMT Right eval Left eval Right 04/23/23 Right/Left 05/07/23  Hip flexion 4 4  4+/5  Hip abduction (modified sitting) 5 5    Hip internal rotation 4 5    Hip external rotation 3+ 5 4- 4-/5  Knee flexion 4 4+  4+/5  Knee extension 5 5    Ankle dorsiflexion 5 5     (Blank rows = not tested) (Key: WFL = within functional limits not  formally assessed, * = concordant pain, s = stiffness/stretching sensation, NT = not tested)  Comments:    LUMBAR SPECIAL TESTS:  Deferred today  FUNCTIONAL TESTS:  5xSTS: 8 sec standard chair no UE support  05/07/23 5xSTS 6.98sec no UE support  GAIT: Distance walked: within clinic Assistive device utilized: None Level of assistance: Complete Independence Comments: reduced gait speed/cadence  TODAY'S TREATMENT:                                                                                                                              [facility] Adult PT Treatment:                                                DATE: 05/07/23 Therapeutic Exercise: Seated IR x5 blue band Seated ER x5 blue band Supine 90 90 toe taps x10 each LE Bridge x10  Green band paloff x10 BIL HEP review/education with handout   Therapeutic Activity: 5xSTS + education MSK assessment + education Education/discussion re: progress with PT, symptom behavior as it affects activity tolerance, PT goals/POC, discharge education, follow up with provider       PATIENT EDUCATION:  Education details: rationale for interventions, HEP, relevant anatomy/physiology, discharge education, follow up with provider Person educated: Patient Education method: Explanation, Demonstration, Tactile cues, Verbal cues, and Handouts Education comprehension:  verbalized understanding, returned demonstration, verbal cues required, tactile cues required, and needs further education    HOME EXERCISE PROGRAM: Access Code: [email or web address] URL: [email or web address] Date: 04/28/2023 Prepared by: Lulu Riding  Exercises - Seated Hip Internal Rotation with Newman Pies and Resistance  - 2-3 x daily - 7 x weekly - 1 sets - 8 reps - Supine Posterior Pelvic Tilt  - 2-3 x daily - 7 x weekly - 1 sets - 10 reps - Sit to Stand with Armchair  - 2-3 x daily - 7 x weekly - 1 sets - 8 reps - Supine March with Posterior Pelvic Tilt  - 1 x daily - 7 x weekly - 1-2 sets - 10 reps - Hooklying Clamshell with Resistance  - 2 x daily - 7 x weekly - 2-3 sets - 10 reps - 5 hold - Supine Bridge  - 1 x daily - 7 x weekly - 2-3 sets - 10 reps - Supine 90/90 Abdominal Bracing  - 1 x daily - 7 x weekly - 2 sets - 5 reps - 10 sec hold - Standing Anti-Rotation Press with Anchored Resistance  - 1 x daily - 7 x weekly - 2 sets - 10 reps  ASSESSMENT:  CLINICAL IMPRESSION: 05/07/2023 Pt arrives w/o pain, in discussion with pt she states she remains fairly active and independent with exercise but denies any significant change in pain frequency/intensity compared to start of care. She continues with some deficits in hip strength and lumbar  mobility although functional goals have overall progressed well (see LTG section below, remains limited with basic standing tolerance). In discussion w/ pt, mutual decision made to discharge to independent HEP at this time given reported lack of change in symptoms and follow up with referring provider. No adverse events, does well with HEP and verbalizes good understanding and agreement with plan. Departs today's session in no acute distress without pain, denies any questions/concerns at this time, states she feels confident discharging with HEP and will follow up with her provider.    Per eval - Pt is a very pleasant 63 year old woman who arrives  to PT evaluation on this date for low back pain. Pt reports difficulty with daily activities, standing, and walking due to pain. During today's session pt demonstrates limitations in lumbar mobility and LE strength (R more affected than L) which are likely contributing to difficulty with aforementioned activities. Recommend skilled PT to address aforementioned deficits to improve functional independence/tolerance. HEP deferred given time constraints with late check in. No adverse events, pt tolerates exam well. Pt departs today's session in no acute distress, all voiced questions/concerns addressed appropriately from PT perspective.    OBJECTIVE IMPAIRMENTS: decreased activity tolerance, decreased endurance, decreased mobility, difficulty walking, decreased ROM, decreased strength, improper body mechanics, postural dysfunction, and pain.   ACTIVITY LIMITATIONS: carrying, lifting, bending, standing, stairs, transfers, and locomotion level  PARTICIPATION LIMITATIONS: meal prep, cleaning, laundry, and community activity  PERSONAL FACTORS: Age, Time since onset of injury/illness/exacerbation, and 3+ comorbidities: anxiety, COPD, HTN, sickle cell trait  are also affecting patient's functional outcome.   REHAB POTENTIAL: Fair given chronicity  CLINICAL DECISION MAKING: Stable/uncomplicated  EVALUATION COMPLEXITY: Low   GOALS: Goals reviewed with patient? No given time constraints  SHORT TERM GOALS: Target date: 04/21/2023    Pt will demonstrate appropriate understanding and performance of initially prescribed HEP in order to facilitate improved independence with management of symptoms.  Baseline: HEP provided on eval 05/05/23: I dont do them regularly, busy 05/07/23: states she has been doing stretching throughout her daily routine, not all at once  Goal status: MET  2. Pt will score greater than or equal to 56 on FOTO in order to demonstrate improved perception of function due to  symptoms.  Baseline: 51  Goal status: MET   LONG TERM GOALS: Target date: 05/12/2023   Pt will score 60 on FOTO in order to demonstrate improved perception of functional status due to symptoms.  Baseline: 51 04/23/23: 83 Goal status: MET  2.  Pt will demonstrate symmetrical and painless lumbar rotation AROM in order to demonstrate improved tolerance to functional movement patterns.  Baseline: see ROM chart above 05/07/23: mild asymmetry in rotation, painless  Goal status: PARTIALLY MET  3.  Pt will demonstrate grossly symmetrical hip/knee MMT in order to demonstrate improved strength for functional movements.  Baseline: see MMT chart above 05/07/23: see MMT chart above  Goal status: PARTIALLY MET   4. Pt will report/demonstrate ability to walk a mile with less than 2 pt increase in pain on NPS in order to facilitate improved community navigation and overall health/QOL.  Baseline: pain w/ half a mile  05/07/23: starts to have pain at one mile   Goal status: MET  5. Pt will report/demonstrate ability to tolerate standing for up to with less than 2 pt increase in pain on NPS in order to facilitate improved tolerance to household tasks.  Baseline: standing tolerance <17min   05/07/23: before increase in  pain  Goal status: NOT MET  6. Pt will be able to navigate 12 stairs with unilat rail with less than 2 pt increase in pain on NPS in order to facilitate improved independence w/ navigation in home.   Baseline: reports difficulty w/ stairs due to low back and COPD  05/05/23: able to negotiate 16 stairs in clinic without increased pain , no HR  Goal status: MET  PLAN: DISCHARGE 05/07/23  PT FREQUENCY: NA  PT DURATION: NA  PLANNED INTERVENTIONS: NA  PLAN FOR NEXT SESSION: discharge to independent HEP, follow up with provider   Ashley Murrain PT, DPT 05/07/2023 11:38 AM

## 2023-07-21 ENCOUNTER — Other Ambulatory Visit: Payer: Self-pay

## 2023-07-21 DIAGNOSIS — E0590 Thyrotoxicosis, unspecified without thyrotoxic crisis or storm: Secondary | ICD-10-CM

## 2023-07-23 ENCOUNTER — Other Ambulatory Visit: Payer: 59

## 2023-07-30 ENCOUNTER — Ambulatory Visit: Payer: 59 | Admitting: "Endocrinology

## 2023-08-04 ENCOUNTER — Ambulatory Visit: Payer: 59 | Admitting: Physician Assistant

## 2023-08-04 ENCOUNTER — Other Ambulatory Visit (INDEPENDENT_AMBULATORY_CARE_PROVIDER_SITE_OTHER): Payer: 59

## 2023-08-04 DIAGNOSIS — M79671 Pain in right foot: Secondary | ICD-10-CM

## 2023-08-04 DIAGNOSIS — M25571 Pain in right ankle and joints of right foot: Secondary | ICD-10-CM | POA: Diagnosis not present

## 2023-08-04 DIAGNOSIS — M2012 Hallux valgus (acquired), left foot: Secondary | ICD-10-CM

## 2023-08-04 MED ORDER — LIDOCAINE HCL 1 % IJ SOLN
1.0000 mL | INTRAMUSCULAR | Status: AC | PRN
  Administered 2023-08-04: 1 mL

## 2023-08-04 MED ORDER — METHYLPREDNISOLONE ACETATE 40 MG/ML IJ SUSP
40.0000 mg | INTRAMUSCULAR | Status: AC | PRN
  Administered 2023-08-04: 40 mg via INTRA_ARTICULAR

## 2023-08-04 NOTE — Addendum Note (Signed)
Addended by: Mardene Celeste B on: 08/04/2023 04:49 PM   Modules accepted: Orders

## 2023-08-04 NOTE — Progress Notes (Signed)
Office Visit Note   Patient: Katherine Jarvis           Date of Birth: 09-25-1961           MRN: [id number] Visit Date: 08/04/2023              Requested by: Solmon Ice, MD [address] [place],  [place] [address] PCP: Solmon Ice, MD   Assessment & Plan: Visit Diagnoses:  1. Pain in right foot   2. Hallux valgus (acquired), left foot   3. Sinus tarsi syndrome of right ankle     Plan: Will have her obtain inserts for her shoes she needs arch support in both shoes.  Postinjection right sinus tarsi she had decreased pain in her right foot.  Regards to her hallux valgus deformity we will refer her to Dr. Susa Simmonds at [facility] orthopedics.  Questions were encouraged and answered at length.  Follow-up instructions : Return if symptoms worsen or fail to improve.   Orders:  Orders Placed This Encounter  Procedures   Medium Joint Inj   XR Foot Complete Right   No orders of the defined types were placed in this encounter.     Procedures: Medium Joint Inj (Sinus tarsi ) on 08/04/2023 12:51 PM Indications: pain Details: 25 G 1.5 in needle, anterolateral approach Medications: 1 mL lidocaine 1 %; 40 mg methylPREDNISolone acetate 40 MG/ML      Clinical Data: No additional findings.   Subjective: Chief Complaint  Patient presents with   Right Foot - Pain    HPI Katherine Jarvis is a 62 year old female comes in today for right foot pain.  States she has had increasing foot pain for the last 6 months.  Pain is mostly the lateral toe and lateral foot pain.  States the pain is constant achy sore pain.  She has had no recent injuries to her feet.  She is prediabetic.+  Review of Systems See HPI  Objective: Vital Signs: LMP 09/17/1999   Physical Exam Constitutional:      Appearance: She is normal weight. She is not ill-appearing or diaphoretic.  Pulmonary:     Effort: Pulmonary effort is normal.  Neurological:     Mental Status: She is alert and oriented to  person, place, and time.  Psychiatric:        Mood and Affect: Mood normal.     Ortho Exam Bilateral feet: Sensation grossly intact to light touch throughout.  Dorsal pedal pulses are 2+ and equal symmetric.  Good range of motion bilateral ankles without pain.  Nontender over the left posterior tibial tendon and left peroneal tendons.  5-5 strength in inversion eversion against resistance.  Nontender over the Achilles bilaterally.  Right foot tenderness over sinus Tarsi region.  Nontender over the peroneal tendons.  Tenderness over the distal posterior tibial tendon near the insertion.  She is able to do daily single heel lift bilaterally.  Slight too many toes on the left right negative on the left.  Right foot obvious hallux valgus deformity with soft tissue swelling at the MP joint medially.  No specialty comments available.  Imaging: XR Foot Complete Right  Result Date: 08/04/2023 Right foot 3 views: No acute fracture.  Midfoot arthritic changes with dorsal spurring.  Hallux valgus deformity.  IM angle approximately 18 degrees.  Soft tissue swelling near the bony eminence of the first toe.  Morton's type foot with second third metatarsals being longer than the first.  Talar head  is well covered.    PMFS History: Patient Active Problem List   Diagnosis Date Noted   Acquired hallux rigidus 09/01/2019   Lumbar radiculopathy 09/01/2019   Spondylolisthesis 09/01/2019   Other atopic dermatitis 02/12/2019   COPD (chronic obstructive pulmonary disease) (HCC) 07/22/2018   Prediabetes 02/24/2018   Vitamin D deficiency 10/22/2016   Primary insomnia 10/20/2015   Moderate episode of recurrent major depressive disorder (HCC) 07/05/2015   Subclinical hyperthyroidism 07/05/2015   Bilateral carpal tunnel syndrome 05/31/2015   Primary osteoarthritis involving multiple joints 05/31/2015   Chronic low back pain without sciatica 05/02/2015   Cigarette nicotine dependence without complication  05/02/2015   Primary osteoarthritis of hip 05/02/2015   Back pain 02/01/2013   Hip pain 02/01/2013   Headache 11/28/2010   TOBACCO USER 11/16/2009   BRONCHITIS 07/17/2009   ANEMIA, IRON DEFICIENCY 06/01/2009   HYPERTENSION 06/01/2009   Past Medical History:  Diagnosis Date   Anxiety    Arthritis    Blood transfusion without reported diagnosis    COPD (chronic obstructive pulmonary disease) (HCC)    Hypertension    Sickle cell trait (HCC)     Family History  Problem Relation Age of Onset   Cancer Mother    Colon cancer Mother 38   Colon polyps Neg Hx    Esophageal cancer Neg Hx    Rectal cancer Neg Hx    Stomach cancer Neg Hx     Past Surgical History:  Procedure Laterality Date   ABDOMINAL HYSTERECTOMY     COLONOSCOPY  2007 and 2014   FOOT SURGERY     JOINT REPLACEMENT     ROTATOR CUFF REPAIR     TOTAL HIP ARTHROPLASTY     Social History   Occupational History   Not on file  Tobacco Use   Smoking status: Every Day    Current packs/day: 0.50    Types: Cigarettes   Smokeless tobacco: Never  Vaping Use   Vaping status: Never Used  Substance and Sexual Activity   Alcohol use: Yes    Comment: occasionally-wine   Drug use: Not Currently    Comment: not for many years   Sexual activity: Not on file

## 2024-02-23 ENCOUNTER — Other Ambulatory Visit: Payer: Self-pay | Admitting: Podiatry

## 2024-07-22 ENCOUNTER — Ambulatory Visit (HOSPITAL_COMMUNITY): Admission: EM | Admit: 2024-07-22 | Discharge: 2024-07-22 | Disposition: A | Discharge status: Home / Self Care

## 2024-07-22 ENCOUNTER — Encounter (HOSPITAL_COMMUNITY): Payer: Self-pay

## 2024-07-22 DIAGNOSIS — S60862A Insect bite (nonvenomous) of left wrist, initial encounter: Secondary | ICD-10-CM

## 2024-07-22 NOTE — ED Triage Notes (Signed)
 Pt c/o bug bite to lt wrist since Tuesday. States very itchy and getting bigger. States put Cap[place] on it with some relief.

## 2024-07-22 NOTE — ED Provider Notes (Signed)
 [facility]    CSN: [id number] Arrival date & time: 07/22/24  9188      History   Chief Complaint Chief Complaint  Patient presents with   Insect Bite    HPI Katherine Jarvis is a 63 y.o. female.   Patient presents today due to insect bite of ventral aspect of left wrist that happened on Tuesday.  Patient states that she has been using Bengay on the area with mild relief of itching.  Patient states that she is seeing clear drainage coming from the area but denies purulent drainage or surrounding erythema.     Past Medical History:  Diagnosis Date   Anxiety    Arthritis    Blood transfusion without reported diagnosis    COPD (chronic obstructive pulmonary disease) (HCC)    Hypertension    Sickle cell trait     Patient Active Problem List   Diagnosis Date Noted   Acquired hallux rigidus 09/01/2019   Lumbar radiculopathy 09/01/2019   Spondylolisthesis 09/01/2019   Other atopic dermatitis 02/12/2019   COPD (chronic obstructive pulmonary disease) (HCC) 07/22/2018   Prediabetes 02/24/2018   Vitamin D deficiency 10/22/2016   Primary insomnia 10/20/2015   Moderate episode of recurrent major depressive disorder (HCC) 07/05/2015   Subclinical hyperthyroidism 07/05/2015   Bilateral carpal tunnel syndrome 05/31/2015   Primary osteoarthritis involving multiple joints 05/31/2015   Chronic low back pain without sciatica 05/02/2015   Cigarette nicotine dependence without complication 05/02/2015   Primary osteoarthritis of hip 05/02/2015   Back pain 02/01/2013   Hip pain 02/01/2013   Headache 11/28/2010   TOBACCO USER 11/16/2009   BRONCHITIS 07/17/2009   ANEMIA, IRON DEFICIENCY 06/01/2009   HYPERTENSION 06/01/2009    Past Surgical History:  Procedure Laterality Date   ABDOMINAL HYSTERECTOMY     COLONOSCOPY  2007 and 2014   FOOT SURGERY     JOINT REPLACEMENT     ROTATOR CUFF REPAIR     TOTAL HIP ARTHROPLASTY      OB History   No obstetric history on  file.      Home Medications    Prior to Admission medications   Medication Sig Start Date End Date Taking? Authorizing Provider  ANORO ELLIPTA 62.5-25 MCG/INH AEPB Inhale 1 puff into the lungs 2 (two) times daily. 09/22/19   [provider]  cyclobenzaprine  (FLEXERIL ) 10 MG tablet Take 1 tablet (10 mg total) by mouth 2 (two) times daily as needed for muscle spasms. 06/28/22   Arloa Suzen RAMAN, NP  gabapentin (NEURONTIN) 300 MG capsule Take 300 mg by mouth 3 (three) times daily.    [provider]  gabapentin (NEURONTIN) 300 MG capsule Take 1 capsule by mouth at bedtime. 04/22/22   [provider]  HYDROcodone -acetaminophen  (NORCO/VICODIN) 5-325 MG tablet Take 1-2 tablets by mouth every 6 (six) hours as needed. 06/10/22   Lynwood Lenis, PA-C  losartan -hydrochlorothiazide (HYZAAR) 50-12.5 MG per tablet Take 1 tablet by mouth daily. 02/01/13   Jame Maude FALCON, MD  meloxicam  (MOBIC ) 15 MG tablet TAKE 1 TABLET BY MOUTH ONCE  DAILY 01/28/23   Silva Juliene SAUNDERS, DPM  mupirocin  cream (BACTROBAN ) 2 % Apply 1 application topically 2 (two) times daily. 05/08/16   Belvie Dempsey BROCKS, PA  traMADol (ULTRAM) 50 MG tablet Take 50 mg by mouth every 6 (six) hours as needed for moderate pain.    [provider]  triamcinolone  cream (KENALOG ) 0.1 % Apply 1 Application topically 2 (two) times daily. To affected area  till better 01/22/23   Vonna Sharlet POUR, MD    Family History Family History  Problem Relation Age of Onset   Cancer Mother    Colon cancer Mother 65   Colon polyps Neg Hx    Esophageal cancer Neg Hx    Rectal cancer Neg Hx    Stomach cancer Neg Hx     Social History Social History   Tobacco Use   Smoking status: Every Day    Current packs/day: 0.50    Types: Cigarettes   Smokeless tobacco: Never  Vaping Use   Vaping status: Never Used  Substance Use Topics   Alcohol use: Yes    Comment: occasionally-wine   Drug use: Not Currently    Comment: not  for many years     Allergies   Lisinopril   Review of Systems Review of Systems   Physical Exam Triage Vital Signs ED Triage Vitals [07/22/24 0900]  Encounter Vitals Group     BP 129/85     Girls Systolic BP Percentile      Girls Diastolic BP Percentile      Boys Systolic BP Percentile      Boys Diastolic BP Percentile      Pulse Rate 67     Resp 18     Temp 97.9 F (36.6 C)     Temp Source Oral     SpO2 96 %     Weight      Height      Head Circumference      Peak Flow      Pain Score 0     Pain Loc      Pain Education      Exclude from Growth Chart    No data found.  Updated Vital Signs BP 129/85 (BP Location: Right Arm)   Pulse 67   Temp 97.9 F (36.6 C) (Oral)   Resp 18   LMP 09/17/1999   SpO2 96%   Visual Acuity Right Eye Distance:   Left Eye Distance:   Bilateral Distance:    Right Eye Near:   Left Eye Near:    Bilateral Near:     Physical Exam Vitals and nursing note reviewed.  Constitutional:      General: She is not in acute distress.    Appearance: Normal appearance. She is not ill-appearing, toxic-appearing or diaphoretic.  Eyes:     General: No scleral icterus. Cardiovascular:     Rate and Rhythm: Normal rate and regular rhythm.     Heart sounds: Normal heart sounds.  Pulmonary:     Effort: Pulmonary effort is normal. No respiratory distress.     Breath sounds: Normal breath sounds. No wheezing or rhonchi.  Skin:    General: Skin is warm.     Findings: Lesion present.     Comments: Mildly erythematous lesion of ventral aspect of left wrist with overlying excoriations   Neurological:     Mental Status: She is alert and oriented to person, place, and time.  Psychiatric:        Mood and Affect: Mood normal.        Behavior: Behavior normal.      UC Treatments / Results  Labs (all labs ordered are listed, but only abnormal results are displayed) Labs Reviewed - No data to display  EKG   Radiology No results  found.  Procedures Procedures (including critical care time)  Medications Ordered in UC Medications - No data to display  Initial  Impression / Assessment and Plan / UC Course  I have reviewed the triage vital signs and the nursing notes.  Pertinent labs & imaging results that were available during my care of the patient were reviewed by me and considered in my medical decision making (see chart for details).     Insect bite of left wrist- pt was counseled about supportive treatment for insect bite. Pt was advised to use cool compress, cortisone ointment, and Oral antihistamine  Final Clinical Impressions(s) / UC Diagnoses   Final diagnoses:  Insect bite of left wrist, initial encounter     Discharge Instructions      Use cool compress to affected area, you may use topical steroid cream (cortisone cream), and oral allegra, zyrtec, or benadryl for itching. If you notice pain, increased redness or redness that is spreading, or increased warmth to the area that is a reason to follow-up. With this treatment area should improve in 3 days or so, if it is not improving please follow-up.     ED Prescriptions   None    PDMP not reviewed this encounter.   Andra Corean BROCKS, PA-C 07/22/24 [phone]

## 2024-07-22 NOTE — Discharge Instructions (Signed)
 Use cool compress to affected area, you may use topical steroid cream (cortisone cream), and oral allegra, zyrtec, or benadryl for itching. If you notice pain, increased redness or redness that is spreading, or increased warmth to the area that is a reason to follow-up. With this treatment area should improve in 3 days or so, if it is not improving please follow-up.

## 2024-07-25 ENCOUNTER — Encounter (HOSPITAL_COMMUNITY): Payer: Self-pay

## 2024-07-25 ENCOUNTER — Ambulatory Visit (HOSPITAL_COMMUNITY): Admission: EM | Admit: 2024-07-25 | Discharge: 2024-07-25 | Disposition: A | Discharge status: Home / Self Care

## 2024-07-25 DIAGNOSIS — S50862D Insect bite (nonvenomous) of left forearm, subsequent encounter: Secondary | ICD-10-CM | POA: Diagnosis not present

## 2024-07-25 DIAGNOSIS — L03114 Cellulitis of left upper limb: Secondary | ICD-10-CM

## 2024-07-25 DIAGNOSIS — W57XXXD Bitten or stung by nonvenomous insect and other nonvenomous arthropods, subsequent encounter: Secondary | ICD-10-CM

## 2024-07-25 MED ORDER — DOXYCYCLINE HYCLATE 100 MG PO CAPS: 100.0000 mg | ORAL_CAPSULE | Freq: Two times a day (BID) | ORAL | 0 refills | Status: AC

## 2024-07-25 MED ORDER — MUPIROCIN 2 % EX OINT: 1.0000 | TOPICAL_OINTMENT | Freq: Two times a day (BID) | CUTANEOUS | 0 refills | Status: AC

## 2024-07-25 NOTE — ED Provider Notes (Signed)
 [facility]    CSN: [id number] Arrival date & time: 07/25/24  1036      History   Chief Complaint Chief Complaint  Patient presents with   Insect Bite    HPI Katherine Jarvis is a 63 y.o. female.   Patient presents with concerns for insect bite to her left anterior forearm that occurred on 10/7.  Patient was initially seen here and it was recommended that she apply cortisone cream to the area to help reduce swelling and itching.  Patient states that since then the area has become larger and is now draining purulent drainage.  Patient denies fever, body aches, chills, and weakness.  The history is provided by the patient and medical records.    Past Medical History:  Diagnosis Date   Anxiety    Arthritis    Blood transfusion without reported diagnosis    COPD (chronic obstructive pulmonary disease) (HCC)    Hypertension    Sickle cell trait     Patient Active Problem List   Diagnosis Date Noted   Acquired hallux rigidus 09/01/2019   Lumbar radiculopathy 09/01/2019   Spondylolisthesis 09/01/2019   Other atopic dermatitis 02/12/2019   COPD (chronic obstructive pulmonary disease) (HCC) 07/22/2018   Prediabetes 02/24/2018   Vitamin D deficiency 10/22/2016   Primary insomnia 10/20/2015   Moderate episode of recurrent major depressive disorder (HCC) 07/05/2015   Subclinical hyperthyroidism 07/05/2015   Bilateral carpal tunnel syndrome 05/31/2015   Primary osteoarthritis involving multiple joints 05/31/2015   Chronic low back pain without sciatica 05/02/2015   Cigarette nicotine dependence without complication 05/02/2015   Primary osteoarthritis of hip 05/02/2015   Back pain 02/01/2013   Hip pain 02/01/2013   Headache 11/28/2010   TOBACCO USER 11/16/2009   BRONCHITIS 07/17/2009   ANEMIA, IRON DEFICIENCY 06/01/2009   HYPERTENSION 06/01/2009    Past Surgical History:  Procedure Laterality Date   ABDOMINAL HYSTERECTOMY     COLONOSCOPY  2007 and 2014    FOOT SURGERY     JOINT REPLACEMENT     ROTATOR CUFF REPAIR     TOTAL HIP ARTHROPLASTY      OB History   No obstetric history on file.      Home Medications    Prior to Admission medications   Medication Sig Start Date End Date Taking? Authorizing Provider  doxycycline  (VIBRAMYCIN ) 100 MG capsule Take 1 capsule (100 mg total) by mouth 2 (two) times daily. 07/25/24  Yes Johnie, Jameil Whitmoyer A, NP  mupirocin  ointment (BACTROBAN ) 2 % Apply 1 Application topically 2 (two) times daily. 07/25/24  Yes Talia Hoheisel A, NP  PREVIDENT 5000 PLUS 1.1 % CREA dental cream Place 1 Application onto teeth at bedtime. 05/30/24  Yes [provider]  tiZANidine  (ZANAFLEX ) 4 MG tablet Take 4 mg by mouth every 8 (eight) hours as needed for muscle spasms. 06/21/24  Yes [provider]  ANORO ELLIPTA 62.5-25 MCG/INH AEPB Inhale 1 puff into the lungs 2 (two) times daily. 09/22/19   [provider]  gabapentin (NEURONTIN) 300 MG capsule Take 300 mg by mouth 3 (three) times daily.    [provider]  gabapentin (NEURONTIN) 300 MG capsule Take 1 capsule by mouth at bedtime. 04/22/22   [provider]  HYDROcodone -acetaminophen  (NORCO/VICODIN) 5-325 MG tablet Take 1-2 tablets by mouth every 6 (six) hours as needed. 06/10/22   Lynwood Lenis, PA-C  meloxicam  (MOBIC ) 15 MG tablet TAKE 1 TABLET BY MOUTH ONCE  DAILY 01/28/23   Silva Cornet  R, DPM  olmesartan-hydrochlorothiazide (BENICAR HCT) 40-12.5 MG tablet Take 1 tablet by mouth daily.    [provider]  traMADol (ULTRAM) 50 MG tablet Take 50 mg by mouth every 6 (six) hours as needed for moderate pain.    [provider]  triamcinolone  cream (KENALOG ) 0.1 % Apply 1 Application topically 2 (two) times daily. To affected area till better 01/22/23   Vonna Sharlet POUR, MD    Family History Family History  Problem Relation Age of Onset   Cancer Mother    Colon cancer Mother 53   Colon polyps Neg Hx     Esophageal cancer Neg Hx    Rectal cancer Neg Hx    Stomach cancer Neg Hx     Social History Social History   Tobacco Use   Smoking status: Former    Current packs/day: 0.50    Types: Cigarettes   Smokeless tobacco: Never  Vaping Use   Vaping status: Never Used  Substance Use Topics   Alcohol use: Yes    Comment: occasionally-wine   Drug use: Not Currently    Comment: not for many years     Allergies   Lisinopril and Oxycodone   Review of Systems Review of Systems  Per HPI  Physical Exam Triage Vital Signs ED Triage Vitals [07/25/24 1159]  Encounter Vitals Group     BP 126/78     Girls Systolic BP Percentile      Girls Diastolic BP Percentile      Boys Systolic BP Percentile      Boys Diastolic BP Percentile      Pulse Rate 64     Resp 16     Temp 97.9 F (36.6 C)     Temp Source Oral     SpO2 96 %     Weight      Height      Head Circumference      Peak Flow      Pain Score 5     Pain Loc      Pain Education      Exclude from Growth Chart    No data found.  Updated Vital Signs BP 126/78 (BP Location: Right Arm)   Pulse 64   Temp 97.9 F (36.6 C) (Oral)   Resp 16   LMP 09/17/1999   SpO2 96%   Visual Acuity Right Eye Distance:   Left Eye Distance:   Bilateral Distance:    Right Eye Near:   Left Eye Near:    Bilateral Near:     Physical Exam Vitals and nursing note reviewed.  Constitutional:      General: She is awake. She is not in acute distress.    Appearance: Normal appearance. She is well-developed and well-groomed. She is not ill-appearing.  Skin:    General: Skin is warm and dry.     Findings: Abscess and erythema present.     Comments: Erythematous abscess-like lesion noted to left anterior forearm with active purulent drainage.  Neurological:     Mental Status: She is alert.  Psychiatric:        Behavior: Behavior is cooperative.      UC Treatments / Results  Labs (all labs ordered are listed, but only abnormal  results are displayed) Labs Reviewed - No data to display  EKG   Radiology No results found.  Procedures Procedures (including critical care time)  Medications Ordered in UC Medications - No data to display  Initial Impression /  Assessment and Plan / UC Course  I have reviewed the triage vital signs and the nursing notes.  Pertinent labs & imaging results that were available during my care of the patient were reviewed by me and considered in my medical decision making (see chart for details).     Patient is overall well-appearing.  Vitals are stable.  Exam findings consistent with abscess present.  Prescribed doxycycline  and mupirocin  for infection coverage.  Discussed follow-up, return, and strict ER precautions. Final Clinical Impressions(s) / UC Diagnoses   Final diagnoses:  Insect bite of left forearm, subsequent encounter  Cellulitis of left upper extremity     Discharge Instructions      Start taking doxycycline  twice daily for 10 days for infection coverage related to insect bite. You can also apply mupirocin  on it twice daily for additional infection coverage Keep the area clean dry and covered for infection prevention as well Return here if your symptoms continue or persist If you develop rapid spreading of redness, swelling, aches, chills, weakness please seek immediate medical treatment in the emergency department.   ED Prescriptions     Medication Sig Dispense Auth. Provider   doxycycline  (VIBRAMYCIN ) 100 MG capsule Take 1 capsule (100 mg total) by mouth 2 (two) times daily. 20 capsule Johnie Flaming A, NP   mupirocin  ointment (BACTROBAN ) 2 % Apply 1 Application topically 2 (two) times daily. 22 g Johnie Flaming A, NP      PDMP not reviewed this encounter.   Johnie Flaming A, NP 07/25/24 1256

## 2024-07-25 NOTE — Discharge Instructions (Signed)
 Start taking doxycycline  twice daily for 10 days for infection coverage related to insect bite. You can also apply mupirocin  on it twice daily for additional infection coverage Keep the area clean dry and covered for infection prevention as well Return here if your symptoms continue or persist If you develop rapid spreading of redness, swelling, aches, chills, weakness please seek immediate medical treatment in the emergency department.

## 2024-07-25 NOTE — ED Triage Notes (Signed)
 Patient here today with c/o a bump on the left anterior wrist after getting bit by something on Tuesday. Patient has some swelling, pain, and drainage. Patient has been using a cream that she got from here but not helping.

## 2024-08-02 ENCOUNTER — Encounter: Payer: Self-pay | Admitting: Gastroenterology

## 2024-09-21 ENCOUNTER — Ambulatory Visit: Admitting: Gastroenterology
